# Patient Record
Sex: Male | Born: 1985 | Race: Black or African American | Marital: Single | Smoking: Current every day smoker
Health system: Northeastern US, Academic
[De-identification: ages and names within clinical notes are randomized; demographics above are authoritative.]

## PROBLEM LIST (undated history)

## (undated) DIAGNOSIS — E669 Obesity, unspecified: Secondary | ICD-10-CM

## (undated) HISTORY — PX: ORTHOPEDIC SURGERY: SHX850

## (undated) NOTE — ED Provider Notes (Signed)
Formatting of this note is different from the original.  [facility]  History   57 year old nondiabetic man presents with abscess and drainage of his right leg.  Per the patient, he had surgery with hardware in 2021 to this affected site.  He states for the last 2 to 3 days he has noticed redness and warmth.  There was no recent antecedent trauma.  He reports no insect stings or bites to the area.  He states that the wound started draining spontaneously this morning.    History provided by:  Patient    There is no problem list on file for this patient.    History reviewed. No pertinent past medical history.    Past Surgical History:   Procedure Laterality Date   ? ORIF TIBIA & FIBULA FRACTURES  2021     No family history on file.    Social History     Tobacco Use   ? Smoking status: Every Day     Packs/day: 1.00     Years: 15.00     Pack years: 15.00     Types: Cigarettes   Substance Use Topics   ? Alcohol use: Not Currently   ? Drug use: Not Currently     Sexual Activity     Substance and Sexual Activity   Sexual Activity Not on file     Review of Systems   Constitutional: Negative for chills and fever.   Musculoskeletal: Positive for arthralgias.   Skin: Positive for color change and wound.     Physical Exam     Vitals:    10/22/21 1107   BP: 119/83   Pulse: 78   Resp: 16   Temp: 36.6 C (97.9 F)   TempSrc: Temporal   SpO2: 96%     Physical Exam  Vitals and nursing note reviewed.   Constitutional:       Appearance: Normal appearance. He is not toxic-appearing.   Cardiovascular:      Rate and Rhythm: Normal rate.   Pulmonary:      Effort: Pulmonary effort is normal.   Musculoskeletal:         General: Swelling and tenderness present.      Comments: Patient has 1+ edema from his upper shin to his foot.   Skin:     Capillary Refill: Capillary refill takes less than 2 seconds.      Findings: Erythema and lesion present.      Comments: There is a 3 cm central abscess exiting the patient's previous surgical scar.   There is free-flowing purulent material from the wound.  The area is exquisitely tender.  There is a surrounding 8 x 10 cm area of erythema, warmth and induration.   Neurological:      Mental Status: He is alert and oriented to person, place, and time.   Psychiatric:         Mood and Affect: Mood normal.         Behavior: Behavior normal.         Thought Content: Thought content normal.     ED Procedures   Procedures    ED Course   ED MDM:     Differential Diagnosis:     Differential diagnosis includes:  Abscess, wound infection, hardware infection.    ED Course:     ED course details:  Patient was seen and evaluated.  We discussed imaging at the urgent care however, his case would need to be seen  by orthopedics given the underlying hardware.  Patient opted to go to [facility] directly for full treatment of his wound.  The wound was dressed with nonstick dressing and gauze roll.    Discussion with independent historian(s):     Independent historian(s) utilized:  Patient    Disposition:     Discharge: I had a discussion with the patient and/or guardian regarding discharge diagnosis and plan.  Based on the patient's history, exam and diagnostic evaluation, there is no indication for further emergent intervention or inpatient treatment.  Verbal and written discharge instructions and warnings were provided. Patient was encouraged to return for any worsening symptoms, persisting symptoms, or any other concerns. Patient was provided the opportunity to ask questions.        Follow up: Patient was advised to follow-up as instructed.                  ED Attestation   Attestation      Hoople, Amy, DO  10/22/21 1151    Electronically signed by Laurena Spies, DO at 10/22/2021 11:51 AM EDT

## (undated) NOTE — ED Notes (Signed)
Formatting of this note might be different from the original.  Abscess to right lower leg. Drained spontaneously after taking hot shower this am, purulent drainage.   Electronically signed by Ailene Ards, RN at 10/22/2021 11:05 AM EDT

---

## 2010-11-13 ENCOUNTER — Emergency Department
Admission: EM | Admit: 2010-11-13 | Discharge status: Against Medical Advice | Payer: Self-pay | Source: Ambulatory Visit

## 2012-09-02 ENCOUNTER — Encounter: Payer: Self-pay | Admitting: Emergency Medicine

## 2012-09-02 ENCOUNTER — Emergency Department
Admission: EM | Admit: 2012-09-02 | Disposition: A | Discharge status: Home / Self Care | Payer: Self-pay | Source: Ambulatory Visit | Attending: Emergency Medicine | Admitting: Emergency Medicine

## 2012-09-02 MED ORDER — IBUPROFEN 800 MG PO TABS *I*
800.0000 mg | ORAL_TABLET | Freq: Three times a day (TID) | ORAL | Status: DC | PRN
Start: 2012-09-02 — End: 2021-12-28

## 2012-09-02 MED ORDER — IBUPROFEN 200 MG PO TABS *I*
800.0000 mg | ORAL_TABLET | Freq: Once | ORAL | Status: AC
Start: 2012-09-02 — End: 2012-09-02
  Administered 2012-09-02: 800 mg via ORAL
  Filled 2012-09-02: qty 4

## 2012-09-02 NOTE — ED Procedure Documentation (Signed)
Procedures   Procedures    Dental  Tooth #15- large cavity. Area dried with cotton. Cement mixed and filled in area exposed. Pt tolerated without issue.   Harrietta Guardian, MD

## 2012-09-02 NOTE — ED Notes (Signed)
Pt c/o pain to broken L upper back molar, no swelling noted to mucus membranes, pt medicated as per order see MAR, significant other at bedside, call bell in reach, side rails up x 1

## 2012-09-02 NOTE — ED Notes (Signed)
Pt D/C as per order, pt acknowledged verbal and written D/C instructions, pt ambulates with steady gait

## 2012-09-02 NOTE — ED Provider Notes (Signed)
History     Chief Complaint   Patient presents with   . Dental Pain     Pt to ED with c/o L upper dental pain since yesterday.  States it is really throbbing this morning.       HPI Comments: 27yo male with dental pain. He has had a cavity in his left upper teeth for about a year. It has been hurting since yesterday and worse this morning. No fevers. No trismus. No facial swelling. Pt has pain with air touching area. Didn't take any medication. He doesn't have a dentist but there is one near his house.       History provided by:  Patient  Language interpreter used: No    Is this ED visit related to civilian activity for income:  Not work related      History reviewed. No pertinent past medical history.         History reviewed. No pertinent past surgical history.    History reviewed. No pertinent family history.      Social History      has no tobacco, alcohol, drug, and sexual activity history on file.    Living Situation    Questions Responses    Patient lives with     Homeless     Caregiver for other family member     External Services     Employment     Domestic Violence Risk           Problem List     There is no problem list on file for this patient.      Review of Systems   Review of Systems   Constitutional: Negative for fever, chills, activity change, appetite change and unexpected weight change.   HENT: Positive for dental problem. Negative for hearing loss, nosebleeds, congestion, sore throat, rhinorrhea, trouble swallowing, neck pain and neck stiffness.    Respiratory: Negative for cough, chest tightness, shortness of breath and wheezing.    Cardiovascular: Negative for chest pain, palpitations and leg swelling.   Gastrointestinal: Negative for nausea, vomiting, abdominal pain, diarrhea, abdominal distention and anal bleeding.   Genitourinary: Negative for urgency, hematuria, difficulty urinating and testicular pain.   Musculoskeletal: Negative for myalgias, back pain and arthralgias.   Skin:  Negative for color change, rash and wound.   Neurological: Negative for dizziness, seizures, syncope, facial asymmetry, numbness and headaches.   Psychiatric/Behavioral: Negative for suicidal ideas, hallucinations, confusion and agitation.       Physical Exam     ED Triage Vitals   BP Heart Rate Heart Rate(via Pulse Ox) Resp Temp Temp Source SpO2 O2 Device O2 Flow Rate   09/02/12 0747 09/02/12 0747 -- 09/02/12 0747 09/02/12 0747 09/02/12 0747 09/02/12 0747 09/02/12 0747 --   157/82 mmHg 71  18 36.6 C (97.9 F) TEMPORAL 98 % None (Room air)       Weight           09/02/12 0747           92.987 kg (205 lb)               Physical Exam   Nursing note and vitals reviewed.  Constitutional: He is oriented to person, place, and time. He appears well-developed and well-nourished.   HENT:   Head: Normocephalic and atraumatic.   Mouth/Throat: Dental caries present.       Eyes: EOM are normal. Pupils are equal, round, and reactive to light.   Neck: Normal range  of motion. Neck supple. No JVD present.   Cardiovascular: Normal rate, regular rhythm, normal heart sounds and intact distal pulses.    Pulmonary/Chest: Effort normal and breath sounds normal. No stridor. He has no wheezes. He exhibits no tenderness.   Abdominal: Soft. Bowel sounds are normal. He exhibits no distension. There is no tenderness. There is no rebound and no guarding.   Musculoskeletal: Normal range of motion. He exhibits no tenderness.   Neurological: He is alert and oriented to person, place, and time.   Skin: Skin is warm and dry.   Psychiatric: He has a normal mood and affect. His behavior is normal. Judgment and thought content normal.       Medical Decision Making   <EDMDM>    Initial Evaluation:  ED First Provider Contact    Date/Time Event User Comments    09/02/12 0750 ED Provider First Contact Shaunn Tackitt S Initial Face to Face Provider Contact          Patient seen by me on arrival date of 09/02/2012 at at time of arrival  7:41 AM.  Initial  face to face evaluation time noted above may be discrepant due to patient acuity and delay in documentation.    Assessment:  27 y.o., male comes to the ED with dental pain- cavity    Differential Diagnosis includes cavity tooth #15              Plan: cement coverage, motrin 800mg , no need for antibiotics- no infection evident. Follow up with dentist.       Harrietta Guardian, MD          Harrietta Guardian, MD  09/02/12 [phone]

## 2012-09-02 NOTE — Discharge Instructions (Signed)
The cement covering your tooth should last 1-2 days. Take Motrin for pain. Stay on a soft diet. Follow up with the dentist tomorrow. You can either use one near your house or call [facility] at [place]. They are open 8:30-4pm.     Return to the ED if -  You have a fever.   You develop redness and swelling of your face, jaw, or neck.   You develop swelling around a tooth.   You are unable to open your mouth or cannot swallow.

## 2012-10-24 ENCOUNTER — Emergency Department: Admit: 2012-10-24 | Disposition: A | Discharge status: Home / Self Care | Payer: Self-pay | Source: Ambulatory Visit

## 2012-10-24 ENCOUNTER — Encounter: Payer: Self-pay | Admitting: Emergency Medicine

## 2012-10-24 MED ORDER — CEPHALEXIN 500 MG PO CAPS *I*
500.0000 mg | ORAL_CAPSULE | Freq: Four times a day (QID) | ORAL | Status: DC
Start: 2012-10-24 — End: 2012-10-28

## 2012-10-24 NOTE — Discharge Instructions (Signed)
Follow up with pcp  Take antibiotics as directed  Ibuprofen for pain

## 2012-10-24 NOTE — ED Provider Notes (Signed)
History     Chief Complaint   Patient presents with   . Groin Swelling     HPI Comments: This is a 27 yo male who presents to the ED with complaints of lump in right groin. No fevers or chills. No nausea.  States present x 24 hrs.   Denies straining.       History provided by:  Patient      History reviewed. No pertinent past medical history.         History reviewed. No pertinent past surgical history.    History reviewed. No pertinent family history.      Social History      reports that he has been smoking.  He does not have any smokeless tobacco history on file. He reports that  drinks alcohol. His drug and sexual activity histories are not on file.    Living Situation    Questions Responses    Patient lives with Alone    Homeless     Caregiver for other family member     External Services     Employment     Domestic Violence Risk           Problem List     There is no problem list on file for this patient.      Review of Systems   Review of Systems   Constitutional: Negative for fever and chills.   Gastrointestinal: Negative for nausea, vomiting and abdominal pain.   Genitourinary: Negative for dysuria.   Skin: Negative for wound.   All other systems reviewed and are negative.        Physical Exam     ED Triage Vitals   BP Pulse Heart Rate(via Pulse Ox) Resp Temp Temp Source SpO2 O2 Device O2 Flow Rate   10/24/12 2025 -- 10/24/12 2025 10/24/12 2025 10/24/12 2025 10/24/12 2025 10/24/12 2025 10/24/12 2025 --   137/72 mmHg  85 16 37.6 C (99.7 F) TEMPORAL 100 % None (Room air)       Weight           10/24/12 2025           92.987 kg (205 lb)               Physical Exam   Constitutional: He is oriented to person, place, and time. He appears well-developed and well-nourished.   HENT:   Head: Normocephalic and atraumatic.   Eyes: Conjunctivae and EOM are normal.   Neck: Normal range of motion.   Pulmonary/Chest: Effort normal.   Abdominal: Soft. Hernia confirmed negative in the right inguinal area.              Musculoskeletal: Normal range of motion.   Neurological: He is alert and oriented to person, place, and time.   Skin: Skin is warm and dry.   Psychiatric: He has a normal mood and affect.       Medical Decision Making   <EDMDM>    Initial Evaluation:  ED First Provider Contact    Date/Time Event User Comments    10/24/12 2134 ED Provider First Contact Corliss Skains J Initial Face to Face Provider Contact          Patient seen by me as above    Assessment:  27 y.o., male comes to the ED with indurated region in right groin    Differential Diagnosis includes enlarge lymph node, early abscess  Plan: eval, antibiotics      Landis Martins, PA    Supervising physician Dr. Sima Matas was immediately available      Landis Martins, [place]  10/24/12 2231

## 2012-10-24 NOTE — ED Notes (Signed)
States " lump" right side of groin x 1 day. Denies any injury

## 2012-10-25 ENCOUNTER — Encounter: Payer: Self-pay | Admitting: Emergency Medicine

## 2012-10-25 LAB — CBC AND DIFFERENTIAL
Baso # K/uL: 0 10*3/uL (ref 0.0–0.1)
Basophil %: 0.2 % (ref 0.2–1.2)
Eos # K/uL: 0.1 10*3/uL (ref 0.0–0.5)
Eosinophil %: 1 % (ref 0.8–7.0)
Hematocrit: 43 % (ref 40–51)
Hemoglobin: 15 g/dL (ref 13.7–17.5)
Lymph # K/uL: 1.5 10*3/uL (ref 1.3–3.6)
Lymphocyte %: 12.1 % — ABNORMAL LOW (ref 21.8–53.1)
MCV: 93 fL — ABNORMAL HIGH (ref 79–92)
Mono # K/uL: 1.1 10*3/uL — ABNORMAL HIGH (ref 0.3–0.8)
Monocyte %: 9 % (ref 5.3–12.2)
Neut # K/uL: 9.3 10*3/uL — ABNORMAL HIGH (ref 1.8–5.4)
Platelets: 195 10*3/uL (ref 150–330)
RBC: 4.7 MIL/uL (ref 4.6–6.1)
RDW: 13.5 % (ref 11.6–14.4)
Seg Neut %: 77.5 % — ABNORMAL HIGH (ref 34.0–67.9)
WBC: 12 10*3/uL — ABNORMAL HIGH (ref 4.2–9.1)

## 2012-10-25 LAB — BASIC METABOLIC PANEL
Anion Gap: 10 (ref 7–16)
CO2: 27 mmol/L (ref 20–28)
Calcium: 8.7 mg/dL — ABNORMAL LOW (ref 9.0–10.3)
Chloride: 101 mmol/L (ref 96–108)
Creatinine: 0.9 mg/dL (ref 0.67–1.17)
GFR,Black: 136 *
GFR,Caucasian: 118 *
Glucose: 94 mg/dL (ref 60–99)
Lab: 10 mg/dL (ref 6–20)
Potassium: 4.2 mmol/L (ref 3.3–5.1)
Sodium: 138 mmol/L (ref 133–145)

## 2012-10-25 LAB — LACTATE, VENOUS, WHOLE BLOOD: Lactate VEN,WB: 1.2 mmol/L (ref 0.5–2.2)

## 2012-10-25 MED ORDER — HYDROMORPHONE HCL PF 1 MG/ML IJ SOLN *WRAPPED*
1.0000 mg | Freq: Once | INTRAMUSCULAR | Status: AC
Start: 2012-10-25 — End: 2012-10-25

## 2012-10-25 MED ORDER — SODIUM CHLORIDE 0.9 % IV BOLUS *I*
1000.0000 mL | Freq: Once | Status: AC
Start: 2012-10-25 — End: 2012-10-25
  Administered 2012-10-25: 1000 mL via INTRAVENOUS

## 2012-10-25 MED ORDER — CLINDAMYCIN PHOSPHATE IN D5W 900 MG/50ML IV SOLN *I*
INTRAVENOUS | Status: AC
Start: 2012-10-25 — End: 2012-10-25
  Administered 2012-10-25: 900 mg via INTRAVENOUS
  Filled 2012-10-25: qty 50

## 2012-10-25 MED ORDER — CLINDAMYCIN PHOSPHATE IN D5W 900 MG/50ML IV SOLN *I*
900.0000 mg | Freq: Once | INTRAVENOUS | Status: AC
Start: 2012-10-25 — End: 2012-10-25

## 2012-10-25 MED ORDER — ONDANSETRON HCL 2 MG/ML IV SOLN *I*
INTRAMUSCULAR | Status: AC
Start: 2012-10-25 — End: 2012-10-25
  Administered 2012-10-25: 4 mg via INTRAVENOUS
  Filled 2012-10-25: qty 2

## 2012-10-25 MED ORDER — HYDROMORPHONE HCL PF 1 MG/ML IJ SOLN *WRAPPED*
1.0000 mg | Freq: Once | INTRAMUSCULAR | Status: AC
Start: 2012-10-26 — End: 2012-10-25
  Administered 2012-10-25: 1 mg via INTRAVENOUS
  Filled 2012-10-25: qty 1

## 2012-10-25 MED ORDER — ONDANSETRON HCL 2 MG/ML IV SOLN *I*
4.0000 mg | Freq: Once | INTRAMUSCULAR | Status: AC
Start: 2012-10-25 — End: 2012-10-25

## 2012-10-25 MED ORDER — HYDROMORPHONE HCL PF 1 MG/ML IJ SOLN *WRAPPED*
INTRAMUSCULAR | Status: AC
Start: 2012-10-25 — End: 2012-10-25
  Administered 2012-10-25: 1 mg via INTRAVENOUS
  Filled 2012-10-25: qty 1

## 2012-10-26 ENCOUNTER — Inpatient Hospital Stay
Admit: 2012-10-26 | Disposition: A | Discharge status: Home / Self Care | Payer: Self-pay | Source: Ambulatory Visit | Admitting: Emergency Medicine

## 2012-10-26 ENCOUNTER — Encounter: Payer: Self-pay | Admitting: Emergency Medicine

## 2012-10-26 DIAGNOSIS — L0390 Cellulitis, unspecified: Principal | ICD-10-CM | POA: Diagnosis present

## 2012-10-26 LAB — CBC AND DIFFERENTIAL
Baso # K/uL: 0 10*3/uL (ref 0.0–0.1)
Basophil %: 0.2 % (ref 0.2–1.2)
Eos # K/uL: 0.2 10*3/uL (ref 0.0–0.5)
Eosinophil %: 1.9 % (ref 0.8–7.0)
Hematocrit: 37 % — ABNORMAL LOW (ref 40–51)
Hemoglobin: 12.6 g/dL — ABNORMAL LOW (ref 13.7–17.5)
Lymph # K/uL: 2.1 10*3/uL (ref 1.3–3.6)
Lymphocyte %: 20.4 % — ABNORMAL LOW (ref 21.8–53.1)
MCV: 93 fL — ABNORMAL HIGH (ref 79–92)
Mono # K/uL: 1.1 10*3/uL — ABNORMAL HIGH (ref 0.3–0.8)
Monocyte %: 10.4 % (ref 5.3–12.2)
Neut # K/uL: 6.9 10*3/uL — ABNORMAL HIGH (ref 1.8–5.4)
Platelets: 178 10*3/uL (ref 150–330)
RBC: 4 MIL/uL — ABNORMAL LOW (ref 4.6–6.1)
RDW: 13.6 % (ref 11.6–14.4)
Seg Neut %: 66.8 % (ref 34.0–67.9)
WBC: 10.2 10*3/uL — ABNORMAL HIGH (ref 4.2–9.1)

## 2012-10-26 LAB — AEROBIC CULTURE

## 2012-10-26 LAB — BASIC METABOLIC PANEL
Anion Gap: 8 (ref 7–16)
CO2: 23 mmol/L (ref 20–28)
Calcium: 8.2 mg/dL — ABNORMAL LOW (ref 9.0–10.3)
Chloride: 104 mmol/L (ref 96–108)
Creatinine: 0.75 mg/dL (ref 0.67–1.17)
GFR,Black: 147 *
GFR,Caucasian: 127 *
Glucose: 103 mg/dL — ABNORMAL HIGH (ref 60–99)
Lab: 7 mg/dL (ref 6–20)
Potassium: 3.9 mmol/L (ref 3.3–5.1)
Sodium: 135 mmol/L (ref 133–145)

## 2012-10-26 MED ORDER — IBUPROFEN 400 MG PO TABS *I*
400.0000 mg | ORAL_TABLET | Freq: Four times a day (QID) | ORAL | Status: DC | PRN
Start: 2012-10-26 — End: 2012-10-28
  Administered 2012-10-26 – 2012-10-28 (×5): 400 mg via ORAL
  Filled 2012-10-26 (×5): qty 1

## 2012-10-26 MED ORDER — ONDANSETRON HCL 2 MG/ML IV SOLN *I*
4.0000 mg | Freq: Four times a day (QID) | INTRAMUSCULAR | Status: AC | PRN
Start: 2012-10-26 — End: 2012-10-28

## 2012-10-26 MED ORDER — CLINDAMYCIN PHOSPHATE IN D5W 600 MG/50ML IV SOLN *I*
600.0000 mg | Freq: Four times a day (QID) | INTRAVENOUS | Status: DC
Start: 2012-10-26 — End: 2012-10-26

## 2012-10-26 MED ORDER — OXYCODONE-ACETAMINOPHEN 5-325 MG PO TABS *I*
2.0000 | ORAL_TABLET | ORAL | Status: DC | PRN
Start: 2012-10-26 — End: 2012-10-28
  Administered 2012-10-26 – 2012-10-28 (×11): 2 via ORAL
  Filled 2012-10-26 (×11): qty 2

## 2012-10-26 MED ORDER — CLINDAMYCIN PHOSPHATE IN D5W 900 MG/50ML IV SOLN *I*
900.0000 mg | Freq: Three times a day (TID) | INTRAVENOUS | Status: DC
Start: 2012-10-26 — End: 2012-10-28
  Administered 2012-10-26 – 2012-10-28 (×7): 900 mg via INTRAVENOUS
  Filled 2012-10-26 (×10): qty 50

## 2012-10-26 NOTE — ED Notes (Signed)
10/26/12 0148   Observation Care   Observation care initiated  Yes   Patient has been verbally notified of their observation status Yes

## 2012-10-26 NOTE — Progress Notes (Signed)
Patient spoke to Service via phone.  Patient reports a preference diet of no beef.  Writer has added the diet preference of Vegetarian Semi to current diet per patient request.

## 2012-10-26 NOTE — Progress Notes (Signed)
Utilization Management    Level of Care Observation service as of the date 10/25/2012      Sandrea Hughs, RN     Pager: [phone]

## 2012-10-26 NOTE — ED Provider Notes (Addendum)
History     Chief Complaint   Patient presents with   . Groin Swelling     States he was here yesterday and the medicine that they gave me for my problem made it worse. States it is starting to get bigger and "now my penis is swollen".     HPI Comments: This is a 27 yo male who presents with complaints of worsening swelling and pain and now redness to right groin. Was seen yesterday and i diagnosed pt with enlarged lymph nodes. Has been present now x 48 hrs. Pt was started on keflex and discharged. Pt states this afternoon worsened and pain increased. Has been taking keflex as directed      History provided by:  Patient      History reviewed. No pertinent past medical history.         History reviewed. No pertinent past surgical history.    History reviewed. No pertinent family history.      Social History      reports that he has been smoking.  He does not have any smokeless tobacco history on file. He reports that  drinks alcohol. His drug and sexual activity histories are not on file.    Living Situation    Questions Responses    Patient lives with Spouse    Homeless     Caregiver for other family member     External Services     Employment     Domestic Violence Risk           Problem List     There is no problem list on file for this patient.      Review of Systems   Review of Systems   Constitutional: Positive for fever. Negative for chills.   Respiratory: Negative for shortness of breath.    Cardiovascular: Negative for chest pain.   Gastrointestinal: Negative for nausea, vomiting and abdominal pain.   Genitourinary: Negative for dysuria, flank pain, penile swelling, scrotal swelling, difficulty urinating and testicular pain.        Right groin swelling     Musculoskeletal: Negative for myalgias.   Skin: Negative for rash.   All other systems reviewed and are negative.        Physical Exam     ED Triage Vitals   BP Heart Rate Heart Rate(via Pulse Ox) Resp Temp Temp Source SpO2 O2 Device O2 Flow Rate      10/25/12 2120 10/25/12 2120 -- 10/25/12 2120 10/25/12 2120 10/25/12 2120 10/25/12 2120 10/25/12 2120 --   144/58 mmHg 112  18 38.1 C (100.6 F) TEMPORAL 100 % None (Room air)       Weight           10/25/12 2120           92.987 kg (205 lb)               Physical Exam   Constitutional: He is oriented to person, place, and time. He appears well-developed and well-nourished.   HENT:   Head: Normocephalic and atraumatic.   Mouth/Throat: Oropharynx is clear and moist.   Eyes: Conjunctivae and EOM are normal.   Neck: Normal range of motion.   Cardiovascular: Regular rhythm and normal heart sounds.  Tachycardia present.    Pulmonary/Chest: Effort normal and breath sounds normal.   Abdominal: Soft. Bowel sounds are normal.   Musculoskeletal: Normal range of motion.        Legs:  Neurological: He is  alert and oriented to person, place, and time.   Skin: Skin is warm and dry.   Psychiatric: He has a normal mood and affect.       Medical Decision Making      Amount and/or Complexity of Data Reviewed  Clinical lab tests: ordered and reviewed  Tests in the radiology section of CPT: ordered and reviewed      Recent Results (from the past 24 hour(s))   CBC AND DIFFERENTIAL    Collection Time     10/25/12 10:05 PM       Result Value Range    WBC 12.0 (*) 4.2 - 9.1 THOU/uL    RBC 4.7  4.6 - 6.1 MIL/uL    Hemoglobin 15.0  13.7 - 17.5 g/dL    Hematocrit 43  40 - 51 %    MCV 93 (*) 79 - 92 fL    RDW 13.5  11.6 - 14.4 %    Platelets 195  150 - 330 THOU/uL    Seg Neut % 77.5 (*) 34.0 - 67.9 %    Lymphocyte % 12.1 (*) 21.8 - 53.1 %    Monocyte % 9.0  5.3 - 12.2 %    Eosinophil % 1.0  0.8 - 7.0 %    Basophil % 0.2  0.2 - 1.2 %    Neut # K/uL 9.3 (*) 1.8 - 5.4 THOU/uL    Lymph # K/uL 1.5  1.3 - 3.6 THOU/uL    Mono # K/uL 1.1 (*) 0.3 - 0.8 THOU/uL    Eos # K/uL 0.1  0.0 - 0.5 THOU/uL    Baso # K/uL 0.0  0.0 - 0.1 THOU/uL   BASIC METABOLIC PANEL    Collection Time     10/25/12 10:05 PM       Result Value Range    Glucose 94  60 - 99  mg/dL    Sodium 161  096 - 045 mmol/L    Potassium 4.2  3.3 - 5.1 mmol/L    Chloride 101  96 - 108 mmol/L    CO2 27  20 - 28 mmol/L    Anion Gap 10  7 - 16    UN 10  6 - 20 mg/dL    Creatinine 4.09  8.11 - 1.17 mg/dL    GFR,Caucasian 914      GFR,Black 136      Calcium 8.7 (*) 9.0 - 10.3 mg/dL   BLOOD CULTURE    Collection Time     10/25/12 10:05 PM       Result Value Range    Bacterial Blood Culture .     LACTATE, VENOUS, WHOLE BLOOD    Collection Time     10/25/12 10:05 PM       Result Value Range    Lactate VEN,WB 1.2  0.5 - 2.2 mmol/L     Ct Pelvis With Contrast    10/26/2012        * * P R E L I M I N A R Y  R E P O R T * *  Exam Site: [facility]  10/25/2012 11:39 PM CT PELVIS WITH CONTRAST   ORDERING CLINICAL INFORMATION:  ERECORD: ? abscess right groin,  tender overlying celulitis ADDITIONAL CLINICAL INFORMATION:  None.   COMPARISON:  None.   PROCEDURE: CT images were acquired from the Iliac crests through the  mid thighs.  123 cc of Omnipaque 350 IV contrast was administered.  PELVIC FINDINGS:   Visualized Reproductive Organs: Normal   Bladder: Normal   Lymph Nodes: Inguinal lymphadenopathy on the right, likely reactive.   Vessels: Normal   Pelvic GI Tract/Mesentery and Peritoneal Cavity: No free fluid.  Visualized portion of the bowel is unremarkable.   Soft Tissues/Musculoskeletal: There is right anterior skin thickening  and inflammatory stranding with edema. No discrete drainable  collections. No evidence of muscle or bone involvement.   No hernias.  No acute bone abnormality.         * * P R E L I M I N A R Y  R E P O R T * *    10/26/2012   IMPRESSION:   Right anterior pelvis soft tissue changes consistent with cellulitis  with no discrete drainable collections.       END REPORT       I have personally reviewed the image(s) and the resident's  interpretation and agree with or edited the findings.      Initial Evaluation:  ED First Provider Contact    Date/Time Event User Comments     10/25/12 2126 ED Provider First Contact Corliss Skains J Initial Face to Face Provider Contact          Patient seen by me as above    Assessment:  27 y.o., male comes to the ED with right groin swelling, pain    Differential Diagnosis includes cellulitis, abscess,  Enlarge lymph nodes             Plan: eval, Ct pelvis, pain control, labs, will attempt I&D.    I&D completed, no drainage noted. Will reassess after ct pelvis. Will start clindamycin IV      Landis Martins, PA    Supervising physician Dr. Genevie Ann was immediately available      Landis Martins, PA  10/26/12 0010    Landis Martins, [place]  10/26/12 0051    APP Review:     I had face-to-face interaction with the patient on arrival date of 10/25/2012 at the time of arrival 9:15 PM.      I was asked by APP to see this patient due to the complexity of the current medical presentation.    I have reviewed and agree with the above documentation and, in addition, the history is notable for groin swelling, nothing drainable, ct negative. Failing keflex. IV antibiotics overnight...         Author Harrietta Guardian, MD      Harrietta Guardian, MD  10/26/12 [phone]

## 2012-10-26 NOTE — ED Notes (Signed)
Right groin dressing change.

## 2012-10-26 NOTE — ED Obs Notes (Signed)
ED OBSERVATION ADMISSION NOTE    Patient seen by me today, 10/26/2012 at 0655    Current patient status: Observation    History     Chief Complaint   Patient presents with   . Groin Swelling     States he was here yesterday and the medicine that they gave me for my problem made it worse. States it is starting to get bigger and "now my penis is swollen".     HPI Comments: Per ED provider:  This is a 27 yo male who presents with complaints of worsening swelling and pain and now redness to right groin. Was seen yesterday and i diagnosed pt with enlarged lymph nodes. Has been present now x 48 hrs. Pt was started on keflex and discharged. Pt states this afternoon worsened and pain increased. Has been taking keflex as directed      Patient is a 27 y.o. male presenting with skin problem.   History provided by:  Medical records and patient  Skin Problem  Location:  Ano-genital  Ano-genital rash location:  Groin  Quality: painful and swelling    Pain details:     Quality:  Aching    Onset quality:  Gradual    Duration:  2 days    Progression:  Worsening  Ineffective treatments: out patient keflex 10-25-12.  Associated symptoms: fever (38.1)    Associated symptoms: no abdominal pain, no headaches, no joint pain, no nausea, no shortness of breath and not vomiting        History reviewed. No pertinent past medical history.    History reviewed. No pertinent past surgical history.    Family History   Problem Relation Age of Onset   . Heart disease Maternal Aunt    . Heart disease Maternal Grandmother        Social History      reports that he has been smoking.  He does not have any smokeless tobacco history on file. He reports that  drinks alcohol. His drug and sexual activity histories are not on file.    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 Negative for appetite change.   HENT: Negative for congestion.    Eyes: Negative for visual disturbance.   Respiratory: Negative for shortness of breath.    Cardiovascular: Negative for chest pain.   Gastrointestinal: Negative for nausea, vomiting and abdominal pain.   Genitourinary: Negative for dysuria.   Musculoskeletal: Negative for arthralgias.   Skin: Positive for color change (HPI).   Neurological: Negative for headaches.   Psychiatric/Behavioral: Negative for confusion.       Physical Exam   BP 104/70  Pulse 69  Temp(Src) 36.2 C (97.2 F) (Temporal)  Resp 16  Ht 1.803 m (5\' 11" )  Wt 92.987 kg (205 lb)  BMI 28.6 kg/m2  SpO2 99%    Physical Exam   Vitals reviewed.  Constitutional: No distress.   205 pounds   Cardiovascular: Normal rate.    Pulmonary/Chest: Effort normal.   Abdominal: Soft. There is no tenderness. There is no rebound and no guarding.  Genitourinary:   Right pubic STS, tenderness, induration   Musculoskeletal: He exhibits no tenderness.   Neurological: He is alert.   Skin: He is not diaphoretic.   Psychiatric: He has a normal mood and affect.       Tests    ZOX:WRUE    Labs:   All labs in the last 24 hours:   Recent Results (from the past 24 hour(s))   CBC AND DIFFERENTIAL    Collection Time     10/25/12 10:05 PM       Result Value Range    WBC 12.0 (*) 4.2 - 9.1 THOU/uL    RBC 4.7  4.6 - 6.1 MIL/uL    Hemoglobin 15.0  13.7 - 17.5 g/dL    Hematocrit 43  40 - 51 %    MCV 93 (*) 79 - 92 fL    RDW 13.5  11.6 - 14.4 %    Platelets 195  150 - 330 THOU/uL    Seg Neut % 77.5 (*) 34.0 - 67.9 %    Lymphocyte % 12.1 (*) 21.8 - 53.1 %    Monocyte % 9.0  5.3 - 12.2 %    Eosinophil % 1.0  0.8 - 7.0 %    Basophil % 0.2  0.2 - 1.2 %    Neut # K/uL 9.3 (*) 1.8 - 5.4 THOU/uL    Lymph # K/uL 1.5  1.3 - 3.6 THOU/uL    Mono # K/uL 1.1 (*) 0.3 - 0.8 THOU/uL    Eos # K/uL 0.1  0.0 - 0.5 THOU/uL    Baso # K/uL 0.0  0.0 -  0.1 THOU/uL   BASIC METABOLIC PANEL    Collection Time     10/25/12 10:05 PM       Result Value Range    Glucose 94  60 - 99 mg/dL    Sodium 454  098 - 119 mmol/L    Potassium 4.2  3.3 - 5.1 mmol/L    Chloride 101  96 - 108 mmol/L    CO2 27  20 - 28 mmol/L    Anion Gap 10  7 - 16    UN 10  6 - 20 mg/dL    Creatinine 1.47  8.29 - 1.17 mg/dL    GFR,Caucasian 562      GFR,Black 136      Calcium 8.7 (*) 9.0 - 10.3 mg/dL   BLOOD CULTURE    Collection Time     10/25/12 10:05 PM       Result Value Range    Bacterial Blood Culture .     LACTATE, VENOUS, WHOLE BLOOD    Collection Time     10/25/12 10:05 PM       Result Value Range    Lactate VEN,WB 1.2  0.5 - 2.2 mmol/L   BASIC METABOLIC PANEL    Collection Time     10/26/12  5:07 AM       Result Value Range    Glucose 103 (*) 60 - 99 mg/dL    Sodium 130  865 - 784 mmol/L    Potassium 3.9  3.3 - 5.1 mmol/L    Chloride 104  96 - 108 mmol/L    CO2 23  20 - 28 mmol/L    Anion Gap 8  7 - 16    UN 7  6 - 20 mg/dL    Creatinine 6.96  2.95 - 1.17 mg/dL    GFR,Caucasian 284  GFR,Black 147      Calcium 8.2 (*) 9.0 - 10.3 mg/dL   CBC AND DIFFERENTIAL    Collection Time     10/26/12  5:07 AM       Result Value Range    WBC 10.2 (*) 4.2 - 9.1 THOU/uL    RBC 4.0 (*) 4.6 - 6.1 MIL/uL    Hemoglobin 12.6 (*) 13.7 - 17.5 g/dL    Hematocrit 37 (*) 40 - 51 %    MCV 93 (*) 79 - 92 fL    RDW 13.6  11.6 - 14.4 %    Platelets 178  150 - 330 THOU/uL    Seg Neut % 66.8  34.0 - 67.9 %    Lymphocyte % 20.4 (*) 21.8 - 53.1 %    Monocyte % 10.4  5.3 - 12.2 %    Eosinophil % 1.9  0.8 - 7.0 %    Basophil % 0.2  0.2 - 1.2 %    Neut # K/uL 6.9 (*) 1.8 - 5.4 THOU/uL    Lymph # K/uL 2.1  1.3 - 3.6 THOU/uL    Mono # K/uL 1.1 (*) 0.3 - 0.8 THOU/uL    Eos # K/uL 0.2  0.0 - 0.5 THOU/uL    Baso # K/uL 0.0  0.0 - 0.1 THOU/uL        Imaging:IS Ct Pelvis With Contrast    10/26/2012   IMPRESSION:   1. Right anterior pelvis superficial soft tissue changes consistent  with cellulitis with no discrete drainable  collections.   2. Regional right inguinal lymphadenopathy, likely secondary to  regional cellulitis/infection.       END REPORT       I have personally reviewed the image(s) and the resident's  interpretation and agree with or edited the findings.         Medical Decision Making   <EDMDM>    Assessment:  27 y.o., male placed in OBS after evaluation in the ED for  Groin/pubic STS and pain, WBC 10.2, lactate 1.2, no abscess on CT    Differential Diagnosis includes : cellulitis, no evidence for abscess, no sepsis        Plan: IV clindamycin, warm compresses, analgesia, patient declines nicotine replacement, blood culture pending  Medically preferred DVT prophylaxis: None- short stay, ambulatory      Freddy Finner, MD

## 2012-10-27 DIAGNOSIS — Z720 Tobacco use: Secondary | ICD-10-CM | POA: Diagnosis present

## 2012-10-27 LAB — CBC AND DIFFERENTIAL
Baso # K/uL: 0 10*3/uL (ref 0.0–0.1)
Basophil %: 0.1 % — ABNORMAL LOW (ref 0.2–1.2)
Eos # K/uL: 0.2 10*3/uL (ref 0.0–0.5)
Eosinophil %: 2.3 % (ref 0.8–7.0)
Hematocrit: 39 % — ABNORMAL LOW (ref 40–51)
Hemoglobin: 13.2 g/dL — ABNORMAL LOW (ref 13.7–17.5)
Lymph # K/uL: 1.7 10*3/uL (ref 1.3–3.6)
Lymphocyte %: 20 % — ABNORMAL LOW (ref 21.8–53.1)
MCV: 93 fL — ABNORMAL HIGH (ref 79–92)
Mono # K/uL: 0.8 10*3/uL (ref 0.3–0.8)
Monocyte %: 9.3 % (ref 5.3–12.2)
Neut # K/uL: 5.8 10*3/uL — ABNORMAL HIGH (ref 1.8–5.4)
Platelets: 172 10*3/uL (ref 150–330)
RBC: 4.1 MIL/uL — ABNORMAL LOW (ref 4.6–6.1)
RDW: 13.4 % (ref 11.6–14.4)
Seg Neut %: 68.1 % — ABNORMAL HIGH (ref 34.0–67.9)
WBC: 8.6 10*3/uL (ref 4.2–9.1)

## 2012-10-27 LAB — HOLD SST

## 2012-10-27 NOTE — Progress Notes (Signed)
Utilization Management    Level of Care Inpatient as of the date 10/26/12      Rosaria Ferries, RN     Pager: [phone]

## 2012-10-27 NOTE — ED Notes (Signed)
Pt found smoking in unit bathroom. Writer spoke with patient and told pt smoking is not permitted and cannot happen again. Pt asked writer if he could go outside to smoke and stated he was told "by a staff member yesterday he go out accompanied by  staff and did so. Writer told pt I would not permit him to go out unless provider gives permission. Pt was fine with this plan.

## 2012-10-27 NOTE — ED Notes (Signed)
Pt spoke with Dr Nils Pyle about going outside to smoke. Dr Nils Pyle has given pt permission to go outside unattended to smoke once a day. Pt is aware of plan.

## 2012-10-27 NOTE — ED Notes (Signed)
Pt out to smoke with PCT.

## 2012-10-27 NOTE — Progress Notes (Addendum)
Attending physician progress note:    BP: (116-136)/(60-76)   Temp:  [36.1 C (97 F)-36.8 C (98.2 F)]   Temp src:  [-]   Heart Rate:  [62-64]   Resp:  [16-18]   SpO2:  [97 %-100 %]     Subjective: patient reports that he is feeling slightly better.  But continues to have significant pain in his right inguinal area    Lungs: Clear to auscultation.  No wheezing, crackles or rhonchi noted  Cardiovascular: Regular rate rhythm.  Normal S1-S2.    Abdomen: Soft, nontender and nondistended.  There is induration, warmth and tenderness to palpation over the right inguinal area with a small ulceration draining serosanguineous fluid (Large painful indurated "buboe").  Cellulitic area spreads from the right of his pubic involving the right inguinal area and medial aspect of right thigh  Extremities: No clubbing, cyanosis or edema.      Labs:    Recent Labs  Lab 10/27/12  0519 10/26/12  0507 10/25/12  2205   WBC 8.6 10.2* 12.0*   Hemoglobin 13.2* 12.6* 15.0   Hematocrit 39* 37* 43   Platelets 172 178 195                Labs:    Recent Labs  Lab 10/26/12  0507 10/25/12  2205   Sodium 135 138   Potassium 3.9 4.2   Chloride 104 101   CO2 23 27   UN 7 10   Creatinine 0.75 0.90   Glucose 103* 94   Calcium 8.2* 8.7*              Other labs:          Micro:   Aerobic Culture   Date Value Range Status   10/25/2012 Lab Cancel   Final        Bacterial Blood Culture   Date Value Range Status   10/25/2012 .   Preliminary            Active Hospital Problems    Diagnosis   . Tobacco abuse   . Cellulitis       Assessment and plan: This is a case of right inguinal areas cellulitis and small ulceration in  a 27 year old [place] American male who denies new sexual contact, penile discharge or risk factors for HIV.  On questioning patient did display some concern for sexually transmitted diseases and wanted to know you are checking for "sexually-transmitted diseases", are you not?Marland Kitchen    If patient consents, check HIV serology, RPR, titer for  LGV  PCR for HSV from the base of ulcer  Hepatitis B and C. serology  Urine nucleic acid acid amplification (NAAT) on urine for Chlamydia and gonorrhea  For now continue clindamycin  May Benefit from infectious disease consult if symptoms don't improve in next 24-48 hours  Continue warm compresses    Patient is very eager to smoke and does not want nicotine patch.  He was found smoking in the bathroom earlier by nursing staff  I told him that it is safer for him to call to smoke rather than smoke in hiding at the hospital.  I told him I will allow him to go out one time a day    Rexene Edison, MD FACP  Assistant Professor of Clinical Medicine / Hospitalist  Pager [phone]

## 2012-10-28 LAB — HEPATITIS B & C PROF
HBV Core Ab: POSITIVE — AB
HBV S Ab Quant: 734.71 m[IU]/mL
HBV S Ab: POSITIVE
HBV S Ag: NEGATIVE
Hep C Ab: NEGATIVE

## 2012-10-28 LAB — CBC AND DIFFERENTIAL
Baso # K/uL: 0 10*3/uL (ref 0.0–0.1)
Basophil %: 0.2 % (ref 0.2–1.2)
Eos # K/uL: 0.2 10*3/uL (ref 0.0–0.5)
Eosinophil %: 2.1 % (ref 0.8–7.0)
Hematocrit: 41 % (ref 40–51)
Hemoglobin: 14.1 g/dL (ref 13.7–17.5)
Lymph # K/uL: 2 10*3/uL (ref 1.3–3.6)
Lymphocyte %: 22.7 % (ref 21.8–53.1)
MCV: 93 fL — ABNORMAL HIGH (ref 79–92)
Mono # K/uL: 0.8 10*3/uL (ref 0.3–0.8)
Monocyte %: 8.6 % (ref 5.3–12.2)
Neut # K/uL: 5.8 10*3/uL — ABNORMAL HIGH (ref 1.8–5.4)
Platelets: 206 10*3/uL (ref 150–330)
RBC: 4.4 MIL/uL — ABNORMAL LOW (ref 4.6–6.1)
RDW: 13.3 % (ref 11.6–14.4)
Seg Neut %: 66.2 % (ref 34.0–67.9)
WBC: 8.8 10*3/uL (ref 4.2–9.1)

## 2012-10-28 LAB — SYPHILIS SCREEN
Syphilis Screen: NEGATIVE
Syphilis Status: NONREACTIVE

## 2012-10-28 LAB — HSV TYPES 1 AND 2 DNA PCR: HSV Types 1 & 2 DNA PCR: 0

## 2012-10-28 LAB — HIV 1&2 ANTIGEN/ANTIBODY: HIV 1&2 ANTIGEN/ANTIBODY: NONREACTIVE

## 2012-10-28 MED ORDER — HYDROCODONE-ACETAMINOPHEN 10-325 MG PO TABS *I*
1.0000 | ORAL_TABLET | ORAL | Status: DC | PRN
Start: 2012-10-28 — End: 2012-10-28
  Administered 2012-10-28 (×2): 1 via ORAL
  Filled 2012-10-28 (×2): qty 1

## 2012-10-28 MED ORDER — CLINDAMYCIN HCL 150 MG PO CAPS *I*
300.0000 mg | ORAL_CAPSULE | Freq: Four times a day (QID) | ORAL | Status: AC
Start: 2012-10-28 — End: 2012-11-07

## 2012-10-28 NOTE — Discharge Summary (Signed)
Name: [name] MRN: [id number] DOB: 05-15-1985     Admit Date: 10/26/2012   Date of Discharge: 10/28/2012    Discharge Attending Physician: Rexene Edison      Hospitalization Summary    CONCISE NARRATIVE: Admitted for right inguinal cellulitis.  Treated with IV Clindamycin with good results.  Testing for STDs completed.  Results pending.  Discharged to PCP follow up.      SIGNIFICANT MED CHANGES: Yes  Clindamycin, 300 mg PO qid x 10 days.      Signed: Evonnie Pat, PA  On: 10/28/2012  at: 2:04 PM

## 2012-10-28 NOTE — Discharge Instructions (Signed)
Your instructions:    1.  Please continue your antibiotics for another 10 days.  A prescription for this antibiotic has been sent to your Pharmacy.  2.  Please follow up with your Doctor for continued care.    Recommended diet: as tolerated    Recommended activity: activity as tolerated.     If you experience any of these symptoms after discharge:  Increased redness or swelling at site of cellulitis, wound.  please follow up with your Doctor.

## 2012-10-28 NOTE — Progress Notes (Signed)
Attending physician progress note:    BP: (115-149)/(64-88)   Temp:  [36 C (96.8 F)-37 C (98.6 F)]   Temp src:  [-]   Heart Rate:  [54-90]   Resp:  [16-18]   SpO2:  [97 %-100 %]     Subjective:  Feels better. Would like to go home.    Lungs: Clear to auscultation.  No wheezing, crackles or rhonchi noted  Cardiovascular: Regular rate rhythm.  Normal S1-S2.    Abdomen: Soft, nontender and nondistended.  There is induration, warmth and tenderness to palpation over the right inguinal area with a small ulceration draining serosanguineous fluid (Large painful indurated "buboe").  Cellulitic area spreads from the right of his pubic involving the right inguinal area and medial aspect of right thigh  Extremities: No clubbing, cyanosis or edema.      Labs:    Recent Labs  Lab 10/28/12  0618 10/27/12  0519 10/26/12  0507   WBC 8.8 8.6 10.2*   Hemoglobin 14.1 13.2* 12.6*   Hematocrit 41 39* 37*   Platelets 206 172 178             Syphilis screen negative  Hepatitis C negative  Hepatitis B serology consistent with resolved infection  HIV nonreactive     Labs:    Recent Labs  Lab 10/26/12  0507 10/25/12  2205   Sodium 135 138   Potassium 3.9 4.2   Chloride 104 101   CO2 23 27   UN 7 10   Creatinine 0.75 0.90   Glucose 103* 94   Calcium 8.2* 8.7*              Other labs:          Micro:   Aerobic Culture   Date Value Range Status   10/25/2012 Lab Cancel   Final        Bacterial Blood Culture   Date Value Range Status   10/25/2012 .   Preliminary            Active Hospital Problems    Diagnosis   . Tobacco abuse   . Cellulitis       Assessment and plan: This is a case of right inguinal areas cellulitis and small ulceration in  a 27 year old [place] American male who denies new sexual contact, penile discharge or risk factors for HIV.      Reviewed lab data with the patient  Ok to d/c on po clinda given improved symptoms   Continue warm compresses    F/u with pcp    Rexene Edison, MD FACP  Assistant Professor of Clinical Medicine  / Hospitalist  Pager [phone]

## 2012-10-28 NOTE — ED Notes (Signed)
Right groin drsg changed for small amt of pale sanguinous drainage. Heat pack applied as ordered.

## 2012-10-30 ENCOUNTER — Telehealth: Payer: Self-pay | Admitting: Internal Medicine

## 2012-10-30 LAB — BLOOD CULTURE: Bacterial Blood Culture: 0

## 2012-10-30 NOTE — Telephone Encounter (Signed)
Spoke with sister  Encouraged follow up with PCP

## 2012-10-31 LAB — GC CULTURE: GC Culture: 0

## 2013-05-25 ENCOUNTER — Emergency Department
Admission: EM | Admit: 2013-05-25 | Disposition: A | Discharge status: Home / Self Care | Payer: Self-pay | Source: Ambulatory Visit | Attending: Emergency Medicine | Admitting: Emergency Medicine

## 2013-05-25 ENCOUNTER — Encounter: Payer: Self-pay | Admitting: Emergency Medicine

## 2013-05-25 MED ORDER — TETANUS-DIPHTH-ACELL PERT 5-2.5-18.5 LF-MCG/0.5 IM SUSP *WRAPPED*
0.5000 mL | Freq: Once | INTRAMUSCULAR | Status: AC
Start: 2013-05-25 — End: 2013-05-25
  Administered 2013-05-25: 0.5 mL via INTRAMUSCULAR
  Filled 2013-05-25: qty 0.5

## 2013-05-25 MED ORDER — IBUPROFEN 400 MG PO TABS *I*
800.0000 mg | ORAL_TABLET | Freq: Once | ORAL | Status: AC
Start: 2013-05-25 — End: 2013-05-25
  Administered 2013-05-25: 800 mg via ORAL
  Filled 2013-05-25: qty 2

## 2013-05-25 NOTE — ED Provider Notes (Addendum)
History     Chief Complaint   Patient presents with    Numbness     HPI Comments: 28 y/o otherwise healthy male presenting for evaluation of "numbness," and hypesthesia in his right 5th finger, ~3 days after he pieced his right hypothenar eminence on the top of a chain-link fence.     No pain, fever/chills, skin redness, or drainage.     Last tetanus unknown.     Denies hitting head or other injuries.           History provided by:  Patient  Language interpreter used: No    Is this ED visit related to civilian activity for income:  Not work related      History reviewed. No pertinent past medical history.         History reviewed. No pertinent past surgical history.    History reviewed. No pertinent family history.      Social History      reports that he has never smoked. He does not have any smokeless tobacco history on file. He reports that he uses illicit drugs (Marijuana). He reports that he does not drink alcohol. His sexual activity history is not on file.    Living Situation    Questions Responses    Patient lives with     Homeless     Caregiver for other family member     External Services     Employment Unemployed    Domestic Violence Risk           Problem List     There is no problem list on file for this patient.      Review of Systems   Review of Systems   Constitutional: Negative for fever, chills, activity change and appetite change.   HENT: Negative for congestion, rhinorrhea, sore throat and trouble swallowing.    Eyes: Negative for pain and visual disturbance.   Respiratory: Negative for cough, chest tightness, shortness of breath and wheezing.    Cardiovascular: Negative for chest pain.   Gastrointestinal: Negative for nausea, vomiting and abdominal pain.   Genitourinary: Negative for dysuria.   Musculoskeletal: Negative for back pain, neck pain and neck stiffness.   Skin: Positive for wound. Negative for rash.   Neurological: Positive for numbness. Negative for dizziness, seizures, syncope,  weakness, light-headedness and headaches.   Hematological: Does not bruise/bleed easily.   Psychiatric/Behavioral: Negative for agitation.       Physical Exam       ED Triage Vitals   BP Heart Rate Heart Rate(via Pulse Ox) Resp Temp Temp Source SpO2 O2 Device O2 Flow Rate   05/25/13 1541 05/25/13 1541 -- 05/25/13 1541 05/25/13 1541 05/25/13 1541 05/25/13 1541 05/25/13 1733 --   119/84 mmHg 91  16 36.4 C (97.5 F) TEMPORAL 98 % None (Room air)       Weight           05/25/13 1541           95.255 kg (210 lb)               Physical Exam   Constitutional: He is oriented to person, place, and time. He appears well-developed and well-nourished. He is cooperative. He does not have a sickly appearance. He does not appear ill. No distress.   HENT:   Head: Normocephalic and atraumatic.   Nose: Nose normal.   Mouth/Throat: Oropharynx is clear and moist and mucous membranes are normal.   Eyes: Conjunctivae  and lids are normal. Pupils are equal, round, and reactive to light.   Neck: Normal range of motion. Neck supple. No spinous process tenderness and no muscular tenderness present.   Cardiovascular: Normal rate, regular rhythm, S1 normal, S2 normal and normal heart sounds.  Exam reveals no gallop.    No murmur heard.  Pulmonary/Chest: Effort normal and breath sounds normal. He has no decreased breath sounds. He has no wheezes. He has no rhonchi. He has no rales.   Abdominal: Soft. Normal appearance. He exhibits no distension. There is no tenderness.   Musculoskeletal:   Right hand. No bony tenderness. Full ROM to flex/ext of all 5 fingers. No weakness in finger abduction.     Flexor sheaths non-tender,no pain with passive extension, no finger edema.      Lymphadenopathy:     He has no cervical adenopathy.        Right: No supraclavicular adenopathy present.        Left: No supraclavicular adenopathy present.   Neurological: He is alert and oriented to person, place, and time. No cranial nerve deficit.   Light touch sensation  present but diminished in right pinky finger compared with left. Right fourth finger has intact sensation.    Skin: Skin is warm and dry. No rash noted.   2 mm scab over the mid hypothenar eminence with no palpable fluctuance, erythema, warmth, drainage, tenderness, or induration.        Psychiatric: He has a normal mood and affect. His speech is normal and behavior is normal. Judgment and thought content normal. Cognition and memory are normal.   Nursing note and vitals reviewed.      Medical Decision Making      Amount and/or Complexity of Data Reviewed  Clinical lab tests: ordered and reviewed  Tests in the radiology section of CPT: ordered and reviewed  Review and summarize past medical records: yes  Independent visualization of images, tracings, or specimens: yes        Initial Evaluation:  ED First Provider Contact    Date/Time Event User Comments    05/25/13 1547 ED Provider First Contact KREBS, DOLORES Initial Face to Face Provider Contact          Patient seen by me today 05/25/2013 at 17:50.     Assessment:  28 y.o., male comes to the ED with right hand hypothenar wound and distal hypesthesia. No evidence of infection or tenosynovitis. Patient's tetanus is out of date.     Differential Diagnosis includes: Abscess, laceration, contusion, seroma, hematoma, retained foreign body.      Plan:   - Tdap  - Xray right hand - negative for fracture or foreign body.   - Reassurance  - Return precautions for infection   - Discharge home       Donato Schultzole Klick, MD        Donato SchultzKlick, Cole, MD  Resident  05/25/13 [phone]    Resident Attestation:     Patient seen by me on arrival date of 05/25/2013 at 5:47 PM    History:   I reviewed this patient, reviewed the resident's note with the above and below addendums  Exam:   I examined this patient, reviewed the resident's note with the above and below addendums    Decision Making:   I discussed with the resident his/her documented decision making with the above and below  addendums.    HPI:  28 year old male presents the ED with chief complaint of right finger numbness.  Patient states that a couple days ago he was jumping a fence when he sustained a puncture wound to the palm of the right hand.  Patient states there has been no swelling or drainage.  Patient states though that he has developed some numbness along the outside of the left pinky.  Patient states she's able to move the finger without difficulty.  Patient states there is only a slight decrease in sensation.  Patient denies any fevers or chills.  Patient denies any nausea vomiting.  Patient has any weakness.  Patient has any recent illness.    Phys:  Gen: Well appearing. No distress, Appears comfortable   HEENT: NCAT, MMM, Pharynx with no erythema or exudate. No nasal discharge, No eye discharge   CV: RRR, No MRG, +S1S2   Resp: CTA B/L, No RRW, Good effort   Abd: Soft, Non Tender, Non Distended, BS x 4, No rebound, Rigidity, or Guarding   Ext: There is a puncture wound on the palm of the right hand in the middle just below the right fifth finger.  There is no induration or fluctuance noted around the puncture site.    Neuro: Alert, Following commands, Moving all 4 exts    Plan:  I agree with the work up and the evaluation of the resident.     Pt will have tetanus, right hand x-ray, Reassurance, Reevaluation. Ultimate disposition pending ED work up and symptom resolution. Pt will likely be d/c home in the setting of normal work up and resolution of symptoms. Pt will be encouraged to follow up with their PCP for further evaluation of continuing symptoms.    Author Clayton Lefort, DO      Oval Cavazos, DO  05/25/13 [phone]

## 2013-05-25 NOTE — ED Notes (Signed)
Pt presents to the ED for numbness of right hand 5th finger. States that he was jumping a fence and sustained a puncture wound to right hand near 5th finger. Now numb and tingling. Denies cp, sob, or abd pain. To room for evaluation.

## 2013-05-25 NOTE — First Provider Contact (Signed)
ED Medical Screening Exam Note    Initial provider evaluation performed by   ED First Provider Contact    Date/Time Event User Comments    05/25/13 1547 ED Provider First Contact [name] Initial Face to Face Provider Contact          Patrick PennaJames Vega is a 28 y.o. male with a history of No past medical history on file. with   Chief Complaint   Patient presents with    Numbness   .    CC: right 5th finger numbness HPI: The patient states his finger has been numb for 4 days. He has full rom, but denies trauma.       Orders placed:  XRAYS     Patient requires further evaluation.     Patrick Gardella A Nature Vogelsang, NP, 05/25/2013, 3:47 PM

## 2013-05-25 NOTE — Discharge Instructions (Signed)
You were seen in the ER for a wound to the hand. It does not appear to be infected at this time. You likely damaged a superficial nerve that will grow back in 1-2 mo.     You may rinse the area but do not scrub it and avoid using soap or other detergents on the laceration.    You can apply vaseline or antibiotic ointment to the laceration daily to keep it from drying out.     RETURN TO THE ER IF: you develop worsening pain, expanding redness, pus-like drainage, fever >100.5 F, or for any other worrisome symptoms.

## 2013-05-25 NOTE — ED Notes (Signed)
4 days numbness/tingling to R fifth finger, ROM intact.  Denies injury.

## 2013-05-25 NOTE — ED Notes (Signed)
Pt signed and stated that he understood his discharge information, pt will be going home with a friend.

## 2014-09-19 ENCOUNTER — Encounter: Payer: Self-pay | Admitting: Plastic Surgery

## 2014-09-19 ENCOUNTER — Emergency Department
Admission: EM | Admit: 2014-09-19 | Disposition: A | Discharge status: Home / Self Care | Payer: Self-pay | Source: Ambulatory Visit

## 2014-09-19 MED ORDER — OXYCODONE HCL 5 MG PO CAPS *A*
10.0000 mg | ORAL_CAPSULE | Freq: Three times a day (TID) | ORAL | 0 refills | Status: DC | PRN
Start: 2014-09-19 — End: 2021-12-28

## 2014-09-19 MED ORDER — HYDROMORPHONE HCL PF 1 MG/ML IJ SOLN *WRAPPED*
1.0000 mg | Freq: Once | INTRAMUSCULAR | Status: AC
Start: 2014-09-19 — End: 2014-09-19
  Administered 2014-09-19: 1 mg via INTRAMUSCULAR
  Filled 2014-09-19: qty 1

## 2014-09-19 MED ORDER — OXYCODONE HCL 5 MG PO TABS *I*
5.0000 mg | ORAL_TABLET | Freq: Once | ORAL | Status: AC
Start: 2014-09-19 — End: 2014-09-19
  Administered 2014-09-19: 5 mg via ORAL
  Filled 2014-09-19: qty 1

## 2014-09-19 MED ORDER — ONDANSETRON HCL 2 MG/ML IV SOLN *I*
4.0000 mg | Freq: Once | INTRAMUSCULAR | Status: AC
Start: 2014-09-19 — End: 2014-09-19
  Administered 2014-09-19: 4 mg via INTRAMUSCULAR
  Filled 2014-09-19: qty 2

## 2014-09-19 MED ORDER — BACITRACIN-POLYMYXIN B 500-10000 UNIT/GM EX OINT *I*
TOPICAL_OINTMENT | Freq: Once | CUTANEOUS | Status: AC
Start: 2014-09-19 — End: 2014-09-19
  Administered 2014-09-19: 1 g via TOPICAL
  Filled 2014-09-19: qty 28.35
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
## 2014-09-19 NOTE — Discharge Instructions (Addendum)
Burn Wound Care Instructions   Perform wound care daily.   Wash the wound(s) daily with gentle soap. Gently scrub open areas with a clean wash cloth or coarse gauze to remove the overlying layer of filmy buildup. If this "biofilm" is not washed away, it traps bacteria and may lead to infection. Pat dry with clean towel or paper towel.   For the body and extremities: Apply a very generous amount of antibiotic ointment (Polysporin, Neosporin, Bacitracin, etc.) to the open areas. Lay a sheet of Xeroform (yellow greasy gauze) over the entire open area. Wrap extremities with gauze roll loosely then wrap with ACE bandage, or cover areas on torso with large gauze and secure with gentle tape. (When wrapping with ACE bandage, start at the point furthest away from your body and wrap toward your body with an overlap of about 1/3-1/2 of the ACE bandage.)   Smoking can reduce the quality of wound healing and increase chances of wound infections, as well as increase chances of developing chronic health problems or worsen conditions you already have. If you smoke, you should quit. Smoking cessation information is available for your review to help you quit. Medications to help you quit are available. Ask your doctor (Unknown, Provider) if you would like to receive these medications.   If you are diabetic, check your blood sugar at least daily or as directed by your physician. Poor blood sugar control can lead to delayed wound healing and infections. Follow up with Unknown, Provider to discuss your diabetic care regimen.   It is important to eat a high protein healthy diet to provide the nutrients required to heal.   Follow up in Burn Surgery clinic as directed. We are located on the second floor of the [facility]. The phone number is [phone]).    Author: Ihor Austin, MD as of: 09/19/2014  at: 11:40 PM       A prescription for pain medication has been sent to the 24 hr pharmacy for you  at [address] that you can pick up tonight
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
## 2014-09-22 ENCOUNTER — Telehealth: Payer: Self-pay | Admitting: Student in an Organized Health Care Education/Training Program

## 2014-09-22 ENCOUNTER — Other Ambulatory Visit: Payer: Self-pay | Admitting: Student in an Organized Health Care Education/Training Program

## 2014-09-22 NOTE — Telephone Encounter (Signed)
Patient called regarding scheduling a clinic follow-up appointment. I verified that the patient had the correct phone number and asked that he call back during business hours tomorrow to schedule his appointment. He agreed.

## 2014-09-23 ENCOUNTER — Ambulatory Visit: Payer: Self-pay | Admitting: Surgery

## 2014-09-23 ENCOUNTER — Encounter: Payer: Self-pay | Admitting: Surgery

## 2014-09-23 ENCOUNTER — Other Ambulatory Visit: Payer: Self-pay

## 2014-09-23 VITALS — BP 132/74 | HR 82 | Temp 96.8°F | Resp 17 | Ht 71.0 in | Wt 256.0 lb

## 2014-09-23 DIAGNOSIS — T2220XA Burn of second degree of shoulder and upper limb, except wrist and hand, unspecified site, initial encounter: Secondary | ICD-10-CM

## 2014-09-23 DIAGNOSIS — T2121XA Burn of second degree of chest wall, initial encounter: Secondary | ICD-10-CM

## 2014-09-23 DIAGNOSIS — T23201A Burn of second degree of right hand, unspecified site, initial encounter: Secondary | ICD-10-CM

## 2014-09-23 MED ORDER — OXYCODONE HCL 5 MG PO CAPS *A*
5.0000 mg | ORAL_CAPSULE | Freq: Four times a day (QID) | ORAL | 0 refills | Status: DC | PRN
Start: 2014-09-23 — End: 2021-12-28

## 2014-09-23 NOTE — Comprehensive Assessment (Signed)
09/23/14 1609   DSRIP intervention status   DSRIP Intervention status Outreach intervention   DSRIP Patient? Yes   Patient Barriers no PCP;uninsured   Patient address confirmation Yes   Patient phone confirmation Yes   Patient insurance confirmation Yes   Patient [place] referred? Yes   Patient PCP confirmation Yes   PCP Appt type New patient appt   Follow up appointment center/address: [facility] [address] [place] [address]   Patient followup provider dr Fayrene Fearing   [facility] Patient followup appointment date 09/30/14   Patient followup appointment time 1510   Patient transport plan self   CHW handoff to involved agency? No   FCM Patient application complete? Approved   FCM Application approved? Medicaid Managed Care   DSRIP Medicaid Managed Care Blue Choice Option   PAM completed? No   Time Spent with Patient (min) 30   [facility]  [employer]   [phone]

## 2014-09-24 ENCOUNTER — Encounter: Payer: Self-pay | Admitting: Surgery
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
## 2014-09-30 ENCOUNTER — Ambulatory Visit: Payer: Self-pay | Admitting: Student in an Organized Health Care Education/Training Program

## 2014-09-30 ENCOUNTER — Ambulatory Visit: Payer: Self-pay

## 2015-06-19 ENCOUNTER — Emergency Department
Admission: EM | Admit: 2015-06-19 | Disposition: A | Discharge status: Home / Self Care | Payer: Self-pay | Source: Ambulatory Visit

## 2015-06-19 ENCOUNTER — Encounter: Payer: Self-pay | Admitting: Emergency Medicine

## 2015-06-19 NOTE — ED Triage Notes (Signed)
Atraumatic outer right leg pain on/off x 1 month.       Triage Note   Angela BurkeMelissa Ravneet Spilker, RN

## 2015-06-19 NOTE — ED Notes (Signed)
Received report, assumed care of patient at this time. Pt resting in bed, NAD. Will cont to monitor and treat per provider orders.

## 2015-06-19 NOTE — ED Notes (Signed)
Presents with right leg pain that radiates from ankle to knee.  Denies injury.

## 2015-06-19 NOTE — ED Notes (Signed)
Pt [facility] stating he has to go get his children on the bus. Pt stated that he will be back later. Pt had no IV, refused VS prior to walking out. Ambulatory on exit.

## 2016-10-21 ENCOUNTER — Emergency Department (HOSPITAL_COMMUNITY)
Admission: EM | Admit: 2016-10-21 | Discharge: 2016-10-21 | Disposition: A | Discharge status: Home / Self Care | Payer: Self-pay | Attending: Emergency Medicine | Admitting: Emergency Medicine

## 2016-10-21 ENCOUNTER — Emergency Department (HOSPITAL_COMMUNITY): Payer: Self-pay

## 2016-10-21 ENCOUNTER — Encounter (HOSPITAL_COMMUNITY): Payer: Self-pay | Admitting: *Deleted

## 2016-10-21 DIAGNOSIS — M25511 Pain in right shoulder: Secondary | ICD-10-CM | POA: Insufficient documentation

## 2016-10-21 DIAGNOSIS — F17200 Nicotine dependence, unspecified, uncomplicated: Secondary | ICD-10-CM | POA: Insufficient documentation

## 2016-10-21 HISTORY — DX: Obesity, unspecified: E66.9

## 2016-10-21 MED ORDER — CYCLOBENZAPRINE HCL 10 MG PO TABS: 10.0000 mg | ORAL_TABLET | Freq: Two times a day (BID) | ORAL | 0 refills | Status: DC | PRN

## 2016-10-21 MED ORDER — NAPROXEN 500 MG PO TABS: 500.0000 mg | ORAL_TABLET | Freq: Two times a day (BID) | ORAL | 0 refills | Status: DC

## 2016-10-21 NOTE — ED Provider Notes (Signed)
[facility]EMER[facility]ENCY DEPT Provider Note   CSN: [id number] Arrival date & time: 10/21/16  0813     History   Chief Complaint Chief Complaint  Patient presents with  . Shoulder Pain    HPI Joseph Lutz is a 31 y.o. male who presents to the emergency department with constant right shoulder pain that began at 4-5 days ago when he awoke. He denies known trauma or injury. He describes the pain as achy. He reports he first that he slept on the shoulder wrong, but the pain did not improve over the last few days. no aggravating or alleviating factors. He reports taking 2 tablets of Tylenol around 3-4 AM this morning without improvement. He denies fever, chills, left shoulder pain, back pain, or neck pain. No weakness or numbness.  No history of surgery or previous injury to the shoulder.   The history is provided by the patient. No language interpreter was used.    Past Medical History:  Diagnosis Date  . Obesity     There are no active problems to display for this patient.   History reviewed. No pertinent surgical history.     Home Medications    Prior to Admission medications   Medication Sig Start Date End Date Taking? Authorizing Provider  cyclobenzaprine (FLEXERIL) 10 MG tablet Take 1 tablet (10 mg total) by mouth 2 (two) times daily as needed for muscle spasms. 10/21/16   Naina Sleeper A, PA-C  naproxen (NAPROSYN) 500 MG tablet Take 1 tablet (500 mg total) by mouth 2 (two) times daily. 10/21/16   Jude Linck, Coral ElseMia A, PA-C    Family History History reviewed. No pertinent family history.  Social History Social History  Substance Use Topics  . Smoking status: Current Every Day Smoker  . Smokeless tobacco: Not on file  . Alcohol use Yes     Allergies   Patient has no known allergies.   Review of Systems Review of Systems  Constitutional: Negative for chills and fever.  Musculoskeletal: Positive for arthralgias and myalgias. Negative for back pain, joint swelling and neck  pain.  Neurological: Negative for weakness and numbness.     Physical Exam Updated Vital Signs BP 113/77 (BP Location: Left Arm)   Pulse 80   Temp 98 F (36.7 C) (Oral)   Resp 17   SpO2 100%   Physical Exam  Constitutional: He appears well-developed.  HENT:  Head: Normocephalic.  Eyes: Conjunctivae are normal.  Neck: Neck supple.  Cardiovascular: Normal rate and regular rhythm.   No murmur heard. Pulmonary/Chest: Effort normal.  Abdominal: Soft. He exhibits no distension.  Musculoskeletal: Normal range of motion. He exhibits tenderness. He exhibits no deformity.  Full active and passive range of motion of the bilateral shoulders, elbows, wrists. Radial pulses are 2+ and symmetric. Sensation is intact throughout the bilateral upper extremities. 5 out of 5 strength against resistance of the large muscle groups of the bilateral upper extremities. Negative empty can test. Tender to palpation over the posterior aspect of the right shoulder. No pain with passive range of motion. Increased pain with active range of motion. No overlying rash, erythema, edema, or warmth.  Neurological: He is alert.  Skin: Skin is warm and dry.  Psychiatric: His behavior is normal.  Nursing note and vitals reviewed.    ED Treatments / Results  Labs (all labs ordered are listed, but only abnormal results are displayed) Labs Reviewed - No data to display  EKG  EKG Interpretation None  Radiology Dg Shoulder Right  Result Date: 10/21/2016 CLINICAL DATA:  Right anterior shoulder pain x Mon, "feel like it in my shoulder joint", no known injury, decreased range of motion. EXAM: RIGHT SHOULDER - 2+ VIEW COMPARISON:  None. FINDINGS: There is no evidence of fracture or dislocation. There is no evidence of arthropathy or other focal bone abnormality. Soft tissues are unremarkable. IMPRESSION: Negative. Electronically Signed   By: Norva Pavlov M.D.   On: 10/21/2016 09:17    Procedures Procedures  (including critical care time)  Medications Ordered in ED Medications - No data to display   Initial Impression / Assessment and Plan / ED Course  I have reviewed the triage vital signs and the nursing notes.  Pertinent labs & imaging results that were available during my care of the patient were reviewed by me and considered in my medical decision making (see chart for details).    Patient X-Ray negative for obvious fracture or dislocation. No suspicion for septic joint or gout at this time. Pt advised to follow up with PCP if symptoms persist. Patient given brace while in ED, conservative therapy recommended and discussed. Patient will be dc home & is agreeable with above plan.  Final Clinical Impressions(s) / ED Diagnoses   Final diagnoses:  Acute pain of right shoulder    New Prescriptions New Prescriptions   CYCLOBENZAPRINE (FLEXERIL) 10 MG TABLET    Take 1 tablet (10 mg total) by mouth 2 (two) times daily as needed for muscle spasms.   NAPROXEN (NAPROSYN) 500 MG TABLET    Take 1 tablet (500 mg total) by mouth 2 (two) times daily.     Barkley Boards, PA-C 10/21/16 1021    Bethann Berkshire, MD 10/24/16 1616

## 2016-10-21 NOTE — Discharge Instructions (Signed)
Please take 1 tablet of naproxen twice a day for the next 7 days. Please take this medication with food to avoid stomach upset. You may also take Flexeril up to 2 times per day to help with muscle spasms and pain. Please do not take this medication if you are working or driving because it can make you sleepy. Please apply ice to your shoulder 3-4 times a day for 15-20 minutes for the next week. You may wear the sling as needed for comfort.  If your symptoms do not start to improve over the next week, please call [facility] and let them know you're following up from a visit in the emergency department.   If you develop new or worsening symptoms, including a fever, if the joint gets red, hot, and swollen, or if you have a fall or injury to the shoulder, please return to the emergency department for reevaluation.

## 2016-10-21 NOTE — ED Triage Notes (Signed)
Pt reports right shoulder pain for several days, pain when lifting his arm. Denies any specific injury.

## 2016-10-21 NOTE — ED Notes (Signed)
Pt states woke up with  Rt shoulder pain x 3 days denies any injury or sleeping on it wrong, pt states hurts to lift arm

## 2017-11-08 ENCOUNTER — Encounter: Payer: Self-pay | Admitting: Emergency Medicine

## 2018-06-14 ENCOUNTER — Emergency Department: Payer: Self-pay

## 2018-06-14 ENCOUNTER — Other Ambulatory Visit: Payer: Self-pay

## 2018-06-14 ENCOUNTER — Emergency Department
Admission: EM | Admit: 2018-06-14 | Discharge: 2018-06-14 | Disposition: A | Discharge status: Home / Self Care | Payer: Self-pay | Attending: Emergency Medicine | Admitting: Emergency Medicine

## 2018-06-14 DIAGNOSIS — R0600 Dyspnea, unspecified: Secondary | ICD-10-CM | POA: Insufficient documentation

## 2018-06-14 DIAGNOSIS — F17210 Nicotine dependence, cigarettes, uncomplicated: Secondary | ICD-10-CM | POA: Insufficient documentation

## 2018-06-14 LAB — TROPONIN I: Troponin I: <0.03 ng/mL (ref <0.03)

## 2018-06-14 LAB — CBC
HCT: 43.4 % (ref 39.0–52.0)
Hemoglobin: 15.3 g/dL (ref 13.0–17.0)
MCH: 31.9 pg (ref 26.0–34.0)
MCHC: 35.3 g/dL (ref 30.0–36.0)
MCV: 90.4 fL (ref 80.0–100.0)
Platelets: 218 10*3/uL (ref 150–400)
RBC: 4.8 MIL/uL (ref 4.22–5.81)
RDW: 13.1 % (ref 11.5–15.5)
WBC: 7.2 10*3/uL (ref 4.0–10.5)
nRBC: 0 % (ref 0.0–0.2)

## 2018-06-14 LAB — BASIC METABOLIC PANEL
Anion gap: 7 (ref 5–15)
BUN: 17 mg/dL (ref 6–20)
CO2: 23 mmol/L (ref 22–32)
Calcium: 9.1 mg/dL (ref 8.9–10.3)
Chloride: 108 mmol/L (ref 98–111)
Creatinine, Ser: 0.98 mg/dL (ref 0.61–1.24)
GFR calc Af Amer: >60 mL/min (ref >60)
GFR calc non Af Amer: >60 mL/min (ref >60)
Glucose, Bld: 116 mg/dL — ABNORMAL HIGH (ref 70–99)
Potassium: 4.3 mmol/L (ref 3.5–5.1)
Sodium: 138 mmol/L (ref 135–145)

## 2018-06-14 NOTE — ED Notes (Signed)
Patient denies pain and is resting comfortably.  

## 2018-06-14 NOTE — ED Triage Notes (Signed)
C/o sob that began this AM after getting off of work, becoming anxious due to SOB and tingling of extremities followed. Denies cough, congestion, fever or sore throat. Denies cp. Pt alert and oriented X4, active, cooperative, pt in NAD. RR even and unlabored, color WNL.

## 2018-06-14 NOTE — ED Provider Notes (Signed)
[facility] Emergency Department Provider Note  Time seen: 9:31 AM  I have reviewed the triage vital signs and the nursing notes.   HISTORY  Chief Complaint Shortness of Breath and Anxiety   HPI Joseph Lutz is a [age] y.o. male with no significant past medical history presents to the emergency department for shortness of breath.  According to the patient since this morning he has been feeling short of breath, describes a chest tightness.  Denies any "pain."  Denies any fever or cough.  Denies any recent travel or known sick exposure.  Currently the patient appears well, satting 98 to 100% on room air during my evaluation.  Past Medical History:  Diagnosis Date  . Obesity     There are no active problems to display for this patient.   History reviewed. No pertinent surgical history.  Prior to Admission medications   Medication Sig Start Date End Date Taking? Authorizing Provider  cyclobenzaprine (FLEXERIL) 10 MG tablet Take 1 tablet (10 mg total) by mouth 2 (two) times daily as needed for muscle spasms. 10/21/16   McDonald, Mia A, PA-C  naproxen (NAPROSYN) 500 MG tablet Take 1 tablet (500 mg total) by mouth 2 (two) times daily. 10/21/16   McDonald, Mia A, PA-C    No Known Allergies  No family history on file.  Social History Social History   Tobacco Use  . Smoking status: Current Every Day Smoker  Substance Use Topics  . Alcohol use: Yes  . Drug use: No    Review of Systems Constitutional: Negative for fever. ENT: Negative for recent illness/congestion Cardiovascular: Negative for chest pain. Respiratory: Mild shortness of breath since this morning Gastrointestinal: Negative for abdominal pain Musculoskeletal: Negative for musculoskeletal complaints Skin: Negative for skin complaints  Neurological: Negative for headache All other ROS negative  ____________________________________________   PHYSICAL EXAM:  VITAL SIGNS: ED Triage Vitals   Enc Vitals Group     BP 06/14/18 0913 (!) 158/76     Pulse Rate 06/14/18 0913 (!) 109     Resp 06/14/18 0913 20     Temp 06/14/18 0913 98.1 F (36.7 C)     Temp Source 06/14/18 0913 Oral     SpO2 06/14/18 0913 100 %     Weight 06/14/18 0914 275 lb (124.7 kg)     Height 06/14/18 0914 5\' 11"  (1.803 m)     Head Circumference --      Peak Flow --      Pain Score 06/14/18 0914 0     Pain Loc --      Pain Edu? --      Excl. in [place]? --    Constitutional: Alert and oriented. Well appearing and in no distress. Eyes: Normal exam ENT      Head: Normocephalic and atraumatic.      Mouth/Throat: Mucous membranes are moist. Cardiovascular: Normal rate, regular rhythm.  Respiratory: Normal respiratory effort without tachypnea nor retractions. Breath sounds are clear Gastrointestinal: Soft and nontender. No distention.  Musculoskeletal: Nontender with normal range of motion in all extremities.  Neurologic:  Normal speech and language. No gross focal neurologic deficits Skin:  Skin is warm, dry and intact.  Psychiatric: Mood and affect are normal.  ____________________________________________    EKG  EKG viewed and interpreted by myself shows sinus tachycardia at 112 bpm with a narrow QRS, normal axis, normal intervals, no concerning ST changes.  ____________________________________________    RADIOLOGY  X-rays negative  ____________________________________________   INITIAL  IMPRESSION / ASSESSMENT AND PLAN / ED COURSE  Pertinent labs & imaging results that were available during my care of the patient were reviewed by me and considered in my medical decision making (see chart for details).   Patient presents to the emergency department for shortness of breath since this morning.  Overall the patient appears well satting 98 to 100% on room air, no cough or fever.  No travel or sick exposure.  We will check labs, chest x-ray and EKG.  Patient agreeable plan of care.  Patient's x-ray  is negative, labs are reassuring including negative troponin.  Pulse rate is now 61.  Overall patient appears extremely well, reassured by work-up.  Discussed return precautions.  Joseph Lutz was evaluated in Emergency Department on 06/14/2018 for the symptoms described in the history of present illness. He was evaluated in the context of the global COVID-19 pandemic, which necessitated consideration that the patient might be at risk for infection with the SARS-CoV-2 virus that causes COVID-19. Institutional protocols and algorithms that pertain to the evaluation of patients at risk for COVID-19 are in a state of rapid change based on information released by regulatory bodies including the CDC and federal and state organizations. These policies and algorithms were followed during the patient's care in the ED.  ____________________________________________   FINAL CLINICAL IMPRESSION(S) / ED DIAGNOSES  Dyspnea   Minna Antis, MD 06/14/18 1056

## 2018-06-14 NOTE — ED Notes (Signed)
This RN woke patient up and requested that patient call his father after speaking with first RN. Pt awoke and stated, "yeah I just got off the phone with him".

## 2018-09-27 ENCOUNTER — Encounter: Payer: Self-pay | Admitting: Emergency Medicine

## 2018-09-27 ENCOUNTER — Emergency Department
Admission: EM | Admit: 2018-09-27 | Discharge: 2018-09-27 | Disposition: A | Discharge status: Home / Self Care | Payer: Self-pay | Attending: Emergency Medicine | Admitting: Emergency Medicine

## 2018-09-27 ENCOUNTER — Other Ambulatory Visit: Payer: Self-pay

## 2018-09-27 DIAGNOSIS — Z202 Contact with and (suspected) exposure to infections with a predominantly sexual mode of transmission: Secondary | ICD-10-CM | POA: Insufficient documentation

## 2018-09-27 DIAGNOSIS — F17200 Nicotine dependence, unspecified, uncomplicated: Secondary | ICD-10-CM | POA: Insufficient documentation

## 2018-09-27 LAB — CHLAMYDIA/NGC RT PCR (ARMC ONLY): N gonorrhoeae: NOT DETECTED

## 2018-09-27 LAB — CHLAMYDIA/NGC RT PCR (ARMC ONLY)??????????: Chlamydia Tr: NOT DETECTED

## 2018-09-27 MED ORDER — METRONIDAZOLE 500 MG PO TABS: 500.0000 mg | ORAL_TABLET | Freq: Two times a day (BID) | ORAL | 0 refills | Status: AC

## 2018-09-27 NOTE — ED Provider Notes (Signed)
[facility] Emergency Department Provider Note  ____________________________________________   None    (approximate)  I have reviewed the triage vital signs and the nursing notes.   HISTORY  Chief Complaint Exposure to STD    HPI Jcion Buddenhagen is a 33 y.o. male presents emergency department stating his girlfriend was diagnosed with trichomoniasis.  He states that she tested negative for gonorrhea and chlamydia.  She had gone to her doctor for pregnancy test and they tested her for all of the above.  He denies any burning with urination or penile discharge.  He denies any fever or chills.    Past Medical History:  Diagnosis Date  . Obesity     There are no active problems to display for this patient.   History reviewed. No pertinent surgical history.  Prior to Admission medications   Medication Sig Start Date End Date Taking? Authorizing Provider  metroNIDAZOLE (FLAGYL) 500 MG tablet Take 1 tablet (500 mg total) by mouth 2 (two) times daily for 7 days. 09/27/18 10/04/18  Versie Starks, PA-C    Allergies Patient has no known allergies.  No family history on file.  Social History Social History   Tobacco Use  . Smoking status: Current Every Day Smoker  . Smokeless tobacco: Never Used  Substance Use Topics  . Alcohol use: Yes  . Drug use: No    Review of Systems  Constitutional: No fever/chills Eyes: No visual changes. ENT: No sore throat. Respiratory: Denies cough Genitourinary: Negative for dysuria.  Positive STD exposure Musculoskeletal: Negative for back pain. Skin: Negative for rash.    ____________________________________________   PHYSICAL EXAM:  VITAL SIGNS: ED Triage Vitals  Enc Vitals Group     BP 09/27/18 1207 130/87     Pulse Rate 09/27/18 1207 78     Resp 09/27/18 1207 16     Temp 09/27/18 1207 98.5 F (36.9 C)     Temp Source 09/27/18 1207 Oral     SpO2 09/27/18 1207 100 %     Weight 09/27/18 1204 274  lb 14.6 oz (124.7 kg)     Height 09/27/18 1204 5\' 11"  (1.803 m)     Head Circumference --      Peak Flow --      Pain Score 09/27/18 1204 0     Pain Loc --      Pain Edu? --      Excl. in [place]? --     Constitutional: Alert and oriented. Well appearing and in no acute distress. Eyes: Conjunctivae are normal.  Head: Atraumatic. Nose: No congestion/rhinnorhea. Mouth/Throat: Mucous membranes are moist.   Neck:  supple no lymphadenopathy noted Cardiovascular: Normal rate, regular rhythm.  Respiratory: Normal respiratory effort.  No retractions,  GU: deferred Musculoskeletal: FROM all extremities, warm and well perfused Neurologic:  Normal speech and language.  Skin:  Skin is warm, dry and intact. No rash noted. Psychiatric: Mood and affect are normal. Speech and behavior are normal.  ____________________________________________   LABS (all labs ordered are listed, but only abnormal results are displayed)  Labs Reviewed  CHLAMYDIA/NGC RT PCR ([facility] ONLY)   ____________________________________________   ____________________________________________  RADIOLOGY    ____________________________________________   PROCEDURES  Procedure(s) performed: No  Procedures    ____________________________________________   INITIAL IMPRESSION / ASSESSMENT AND PLAN / ED COURSE  Pertinent labs & imaging results that were available during my care of the patient were reviewed by me and considered in my medical decision making (see  chart for details).   Patient is 33 year old male presents emergency department concerns of trichomoniasis.  His girlfriend was recently tested for STDs and was positive for this.  Physical exam patient appears well.  Remainder of exam is unremarkable  GC/chlamydia test pending. Discussed treatment with the patient.  He states he would rather take the Flagyl for 1 week instead of doing 2 g all at 1 time.  [facility]  We will call him with the GC/chlamydia results.  He was discharged stable condition.    Leticia PennaJames Eberwein was evaluated in Emergency Department on 09/27/2018 for the symptoms described in the history of present illness. He was evaluated in the context of the global COVID-19 pandemic, which necessitated consideration that the patient might be at risk for infection with the SARS-CoV-2 virus that causes COVID-19. Institutional protocols and algorithms that pertain to the evaluation of patients at risk for COVID-19 are in a state of rapid change based on information released by regulatory bodies including the CDC and federal and state organizations. These policies and algorithms were followed during the patient's care in the ED.   As part of my medical decision making, I reviewed the following data within the electronic MEDICAL RECORD NUMBER Nursing notes reviewed and incorporated, Old chart reviewed, Notes from prior ED visits and [place] Controlled Substance Database  ____________________________________________   FINAL CLINICAL IMPRESSION(S) / ED DIAGNOSES  Final diagnoses:  STD exposure      NEW MEDICATIONS STARTED DURING THIS VISIT:  New Prescriptions   METRONIDAZOLE (FLAGYL) 500 MG TABLET    Take 1 tablet (500 mg total) by mouth 2 (two) times daily for 7 days.     Note:  This document was prepared using Dragon voice recognition software and may include unintentional dictation errors.    Faythe GheeFisher, Susan W, PA-C 09/27/18 1259    Minna AntisPaduchowski, Kevin, MD 09/27/18 2018

## 2018-09-27 NOTE — ED Notes (Signed)
See triage note  Presents stating that he was exposed to [place] by his sexual partner  Denies any sx's at present

## 2018-09-27 NOTE — ED Triage Notes (Signed)
States was exposed to STD.  Here for a check.

## 2020-04-10 ENCOUNTER — Emergency Department
Admission: EM | Admit: 2020-04-10 | Discharge: 2020-04-10 | Disposition: A | Discharge status: Home / Self Care | Payer: 59 | Attending: Emergency Medicine | Admitting: Emergency Medicine

## 2020-04-10 ENCOUNTER — Emergency Department: Payer: 59

## 2020-04-10 ENCOUNTER — Other Ambulatory Visit: Payer: Self-pay

## 2020-04-10 ENCOUNTER — Encounter: Payer: Self-pay | Admitting: Emergency Medicine

## 2020-04-10 DIAGNOSIS — W010XXA Fall on same level from slipping, tripping and stumbling without subsequent striking against object, initial encounter: Secondary | ICD-10-CM | POA: Diagnosis not present

## 2020-04-10 DIAGNOSIS — F17200 Nicotine dependence, unspecified, uncomplicated: Secondary | ICD-10-CM | POA: Insufficient documentation

## 2020-04-10 DIAGNOSIS — Y990 Civilian activity done for income or pay: Secondary | ICD-10-CM | POA: Insufficient documentation

## 2020-04-10 DIAGNOSIS — Y9289 Other specified places as the place of occurrence of the external cause: Secondary | ICD-10-CM | POA: Insufficient documentation

## 2020-04-10 DIAGNOSIS — M25561 Pain in right knee: Secondary | ICD-10-CM | POA: Insufficient documentation

## 2020-04-10 NOTE — ED Provider Notes (Signed)
[facility] Emergency Department Provider Note   ____________________________________________   Event Date/Time   First MD Initiated Contact with Patient 04/10/20 1059     (approximate)  I have reviewed the triage vital signs and the nursing notes.   HISTORY  Chief Complaint Knee Pain    HPI Joseph Lutz is a 35 y.o. male patient presents with right knee pain secondary to a slip and fall which occurred at work yesterday.  Patient state had knee surgery 7 months ago with internal fixation secondary to a work-related injury..  Surgery was done in [place].  Patient continued physical therapy.       Past Medical History:  Diagnosis Date  . Obesity     There are no problems to display for this patient.   History reviewed. No pertinent surgical history.  Prior to Admission medications   Not on File    Allergies Patient has no known allergies.  No family history on file.  Social History Social History   Tobacco Use  . Smoking status: Current Every Day Smoker  . Smokeless tobacco: Never Used  Substance Use Topics  . Alcohol use: Yes  . Drug use: No    Review of Systems Constitutional: No fever/chills Eyes: No visual changes. ENT: No sore throat. Cardiovascular: Denies chest pain. Respiratory: Denies shortness of breath. Gastrointestinal: No abdominal pain.  No nausea, no vomiting.  No diarrhea.  No constipation. Genitourinary: Negative for dysuria. Musculoskeletal: Right knee pain. Skin: Negative for rash. Neurological: Negative for headaches, focal weakness or numbness.  ____________________________________________   PHYSICAL EXAM:  VITAL SIGNS: ED Triage Vitals  Enc Vitals Group     BP 04/10/20 1051 (!) 142/90     Pulse Rate 04/10/20 1051 83     Resp 04/10/20 1051 20     Temp 04/10/20 1051 98.4 F (36.9 C)     Temp Source 04/10/20 1051 Oral     SpO2 04/10/20 1051 94 %     Weight 04/10/20 1051 297 lb (134.7 kg)      Height 04/10/20 1051 5\' 11"  (1.803 m)     Head Circumference --      Peak Flow --      Pain Score 04/10/20 1055 7     Pain Loc --      Pain Edu? --      Excl. in [place]? --    Constitutional: Alert and oriented. Well appearing and in no acute distress. Cardiovascular: Normal rate, regular rhythm. Grossly normal heart sounds.  Good peripheral circulation. Respiratory: Normal respiratory effort.  No retractions. Lungs CTAB. Gastrointestinal: Soft and nontender. No distention. No abdominal bruits. No CVA tenderness. **}Musculoskeletal: No lower extremity tenderness nor edema.  No joint effusions. Neurologic:  Normal speech and language. No gross focal neurologic deficits are appreciated. No gait instability. Skin:  Skin is warm, dry and intact. No rash noted. Psychiatric: Mood and affect are normal. Speech and behavior are normal.  ____________________________________________   LABS (all labs ordered are listed, but only abnormal results are displayed)  Labs Reviewed - No data to display ____________________________________________  EKG    ____________________________________________  RADIOLOGY I, 04/12/20, personally viewed and evaluated these images (plain radiographs) as part of my medical decision making, as well as reviewing the written report by the radiologist.  ED MD interpretation: No acute findings on x-ray of the right knee. Official radiology report(s): DG Knee Complete 4 Views Right  Result Date: 04/10/2020 CLINICAL DATA:  Right knee pain  after fall. History of tibial ORIF approximately 6 months ago EXAM: RIGHT KNEE - COMPLETE 4+ VIEW COMPARISON:  None. FINDINGS: Extensive postsurgical changes from tibial ORIF with proximal lateral sideplate and screw fixation constructs. Additional long intramedullary rod and interlocking screw fixation. No perihardware loosening or fracture. Mild articular surface irregularity of the lateral tibial plateau has appearance  suggestive of healed fracture. No evidence of an acute fracture. No malalignment. Mild tibiofemoral compartment joint space narrowing. A small joint effusion is evident. Soft tissues within normal limits. IMPRESSION: 1. Extensive postsurgical changes from tibial ORIF without evidence of hardware complication. No evidence of an acute osseous abnormality. 2. Small joint effusion. Electronically Signed   By: Duanne Guess D.O.   On: 04/10/2020 11:33    ____________________________________________   PROCEDURES  Procedure(s) performed (including Critical Care):  Procedures   ____________________________________________   INITIAL IMPRESSION / ASSESSMENT AND PLAN / ED COURSE  As part of my medical decision making, I reviewed the following data within the electronic MEDICAL RECORD NUMBER         Patient presented with right knee pain secondary to a fall yesterday. Patient had prior knee surgery several months ago required internal fixation. Discussed no acute findings x-ray. Patient given discharge care instruction follow-up with on-call orthopedics to establish care since he has relocated to this area.      ____________________________________________   FINAL CLINICAL IMPRESSION(S) / ED DIAGNOSES  Final diagnoses:  Acute pain of right knee     ED Discharge Orders    None      *Please note:  Joseph Lutz was evaluated in Emergency Department on 04/10/2020 for the symptoms described in the history of present illness. He was evaluated in the context of the global COVID-19 pandemic, which necessitated consideration that the patient might be at risk for infection with the SARS-CoV-2 virus that causes COVID-19. Institutional protocols and algorithms that pertain to the evaluation of patients at risk for COVID-19 are in a state of rapid change based on information released by regulatory bodies including the CDC and federal and state organizations. These policies and algorithms were  followed during the patient's care in the ED.  Some ED evaluations and interventions may be delayed as a result of limited staffing during and the pandemic.*   Note:  This document was prepared using Dragon voice recognition software and may include unintentional dictation errors.    Joni Reining, PA-C 04/10/20 1233    Minna Antis, MD 04/10/20 [id number]

## 2020-04-10 NOTE — ED Triage Notes (Signed)
Pt states right knee pain after slipping and falling at work yesterday. 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 Had surgery on the same knee in June of '21. Wanting to be sure he didn't damage his knee. NAD.

## 2020-04-10 NOTE — ED Notes (Signed)
See triage note  States he slipped and fell yesterday  having pain to right knee  States he has had surgery on same lower leg/ankle in June 2021

## 2020-04-10 NOTE — Discharge Instructions (Addendum)
No acute findings on x-ray of the right knee.  Advised to follow-up with treating orthopedics and physical therapist.

## 2020-06-25 ENCOUNTER — Encounter: Payer: Self-pay | Admitting: Emergency Medicine

## 2020-06-25 ENCOUNTER — Emergency Department: Payer: 59

## 2020-06-25 ENCOUNTER — Other Ambulatory Visit: Payer: Self-pay

## 2020-06-25 ENCOUNTER — Emergency Department
Admission: EM | Admit: 2020-06-25 | Discharge: 2020-06-25 | Disposition: A | Discharge status: Home / Self Care | Payer: 59 | Attending: Emergency Medicine | Admitting: Emergency Medicine

## 2020-06-25 DIAGNOSIS — W010XXA Fall on same level from slipping, tripping and stumbling without subsequent striking against object, initial encounter: Secondary | ICD-10-CM | POA: Diagnosis not present

## 2020-06-25 DIAGNOSIS — M25461 Effusion, right knee: Secondary | ICD-10-CM | POA: Diagnosis not present

## 2020-06-25 DIAGNOSIS — M25561 Pain in right knee: Secondary | ICD-10-CM | POA: Diagnosis present

## 2020-06-25 DIAGNOSIS — F17200 Nicotine dependence, unspecified, uncomplicated: Secondary | ICD-10-CM | POA: Insufficient documentation

## 2020-06-25 MED ORDER — OXYCODONE-ACETAMINOPHEN 7.5-325 MG PO TABS
1.0000 | ORAL_TABLET | Freq: Once | ORAL | Status: AC
  Administered 2020-06-25: 1 via ORAL
  Filled 2020-06-25: qty 1

## 2020-06-25 MED ORDER — OXYCODONE-ACETAMINOPHEN 7.5-325 MG PO TABS: 1.0000 | ORAL_TABLET | Freq: Four times a day (QID) | ORAL | 0 refills | Status: AC | PRN

## 2020-06-25 NOTE — Discharge Instructions (Signed)
Follow-up with your orthopedic surgeon if any continued problems or concerns.  Wear the knee immobilizer for added support and protection.  A prescription for oxycodone-acetaminophen was sent to your pharmacy.  This is 1 every 6 hours as needed for moderate to severe pain.  Do not drive or operate machinery while taking this medication.  Ice and elevate your knee.  No acute fractures were noted on your x-ray today.

## 2020-06-25 NOTE — ED Provider Notes (Signed)
[facility] Emergency Department Provider Note  ____________________________________________   Event Date/Time   First MD Initiated Contact with Patient 06/25/20 1427     (approximate)  I have reviewed the triage vital signs and the nursing notes.   HISTORY  Chief Complaint Fall   HPI Joseph Lutz is a 35 y.o. male presents to the ED with complaint of right knee pain.  Patient states that he slipped and fell backwards hyperextending his right knee.  Patient has had surgery on his right knee last summer.  Patient denies any head injury or loss of consciousness.  Patient is unable to put full weight on his knee secondary to pain.  He rates his pain as an 8 out of 10.       Past Medical History:  Diagnosis Date  . Obesity     There are no problems to display for this patient.   History reviewed. No pertinent surgical history.  Prior to Admission medications   Medication Sig Start Date End Date Taking? Authorizing Provider  oxyCODONE-acetaminophen (PERCOCET) 7.5-325 MG tablet Take 1 tablet by mouth every 6 (six) hours as needed for severe pain. 06/25/20 06/25/21 Yes Tommi Rumps, PA-C    Allergies Patient has no known allergies.  History reviewed. No pertinent family history.  Social History Social History   Tobacco Use  . Smoking status: Current Every Day Smoker  . Smokeless tobacco: Never Used  Substance Use Topics  . Alcohol use: Yes  . Drug use: No    Review of Systems Constitutional: No fever/chills Eyes: No visual changes. Cardiovascular: Denies chest pain. Respiratory: Denies shortness of breath. Gastrointestinal: No abdominal pain.  No nausea, no vomiting.  Musculoskeletal: Positive for right lower extremity pain. Skin: Negative for rash. Neurological: Negative for headaches, focal weakness or numbness. ____________________________________________   PHYSICAL EXAM:  VITAL SIGNS: ED Triage Vitals [06/25/20 1257]  Enc  Vitals Group     BP (!) 123/92     Pulse Rate 100     Resp 18     Temp 98.3 F (36.8 C)     Temp Source Oral     SpO2 98 %     Weight 275 lb (124.7 kg)     Height 5\' 11"  (1.803 m)     Head Circumference      Peak Flow      Pain Score 8     Pain Loc      Pain Edu?      Excl. in GC?     Constitutional: Alert and oriented. Well appearing and in no acute distress. Eyes: Conjunctivae are normal.  Head: Atraumatic. Neck: No stridor.   Cardiovascular: Normal rate, regular rhythm. Grossly normal heart sounds.  Good peripheral circulation. Respiratory: Normal respiratory effort.  No retractions. Lungs CTAB. Gastrointestinal: Soft and nontender. No distention.  Musculoskeletal: On examination of the right knee there is no gross deformity however there is well-healed surgical scars present.  No gross deformity.  There is some effusion noted on palpation of the patella.  Range of motion is restricted secondary to patient's pain.  Skin is intact.  Pulses are present. Neurologic:  Normal speech and language. No gross focal neurologic deficits are appreciated. Skin:  Skin is warm, dry and intact.  No abrasions or discoloration noted. Psychiatric: Mood and affect are normal. Speech and behavior are normal.  ____________________________________________   LABS (all labs ordered are listed, but only abnormal results are displayed)  Labs Reviewed - No data  to display ____________________________________________  RADIOLOGY Beaulah Corin, personally viewed and evaluated these images (plain radiographs) as part of my medical decision making, as well as reviewing the written report by the radiologist.   Official radiology report(s): DG Tibia/Fibula Right  Result Date: 06/25/2020 CLINICAL DATA:  Fall, prior ORIF tibia EXAM: RIGHT TIBIA AND FIBULA - 2 VIEW COMPARISON:  04/10/2018 FINDINGS: Internal fixation lateral tibial plateau fracture again noted. No new fracture identified. Small joint  effusion superior to the patella. Intramedullary nail extends into the distal tibia with 2 interlocking screws at the level of the distal metaphysis. No acute fracture dislocation. IMPRESSION: 1. No clear acute trauma to the RIGHT tibia. 2. Prior ORIF RIGHT tibia appears intact. 3. Small suprapatellar joint effusion Electronically Signed   By: Genevive Bi M.D.   On: 06/25/2020 13:46    ____________________________________________   PROCEDURES  Procedure(s) performed (including Critical Care):  Procedures   ____________________________________________   INITIAL IMPRESSION / ASSESSMENT AND PLAN / ED COURSE  As part of my medical decision making, I reviewed the following data within the electronic MEDICAL RECORD NUMBER Notes from prior ED visits and [place] Controlled Substance Database  35 year old male presents to the ED with complaint of right knee pain after he slipped and fell backwards hyperextending his knee.  Patient denies any head injury or loss of consciousness.  Patient has had surgery on his right knee last summer and states that it has been painful since he fell.  X-rays were negative for any acute fracture.  Patient was given oxycodone-acetaminophen 7.5 mg while in the ED.  A knee immobilizer was applied and patient is encouraged to ice and elevate his leg as needed for discomfort and swelling.  He is going to follow-up with his orthopedic surgeon from last year if any continued problems with his knee.  ____________________________________________   FINAL CLINICAL IMPRESSION(S) / ED DIAGNOSES  Final diagnoses:  Acute pain of right knee     ED Discharge Orders         Ordered    oxyCODONE-acetaminophen (PERCOCET) 7.5-325 MG tablet  Every 6 hours PRN        06/25/20 1538          *Please note:  Joseph Lutz was evaluated in Emergency Department on 06/25/2020 for the symptoms described in the history of present illness. He was evaluated in the context of the global  COVID-19 pandemic, which necessitated consideration that the patient might be at risk for infection with the SARS-CoV-2 virus that causes COVID-19. Institutional protocols and algorithms that pertain to the evaluation of patients at risk for COVID-19 are in a state of rapid change based on information released by regulatory bodies including the CDC and federal and state organizations. These policies and algorithms were followed during the patient's care in the ED.  Some ED evaluations and interventions may be delayed as a result of limited staffing during and the pandemic.*   Note:  This document was prepared using Dragon voice recognition software and may include unintentional dictation errors.    Tommi Rumps, PA-C 06/25/20 1555    Sharman Cheek, MD 06/25/20 [phone]

## 2020-06-25 NOTE — ED Triage Notes (Signed)
Pt states he slipped and fell backwards hyperextending his right knee. Pt denies hitting head. Pt states had surgery on the right knee last summer and had a rod placed from lower thigh to ankle. Pt states he has not been able to walk on it today.

## 2021-09-17 ENCOUNTER — Emergency Department: Payer: Self-pay

## 2021-09-17 DIAGNOSIS — Z23 Encounter for immunization: Secondary | ICD-10-CM | POA: Insufficient documentation

## 2021-09-17 DIAGNOSIS — S61310A Laceration without foreign body of right index finger with damage to nail, initial encounter: Secondary | ICD-10-CM | POA: Insufficient documentation

## 2021-09-17 DIAGNOSIS — S61311A Laceration without foreign body of left index finger with damage to nail, initial encounter: Secondary | ICD-10-CM

## 2021-09-17 DIAGNOSIS — Y990 Civilian activity done for income or pay: Secondary | ICD-10-CM | POA: Insufficient documentation

## 2021-09-17 DIAGNOSIS — S61012A Laceration without foreign body of left thumb without damage to nail, initial encounter: Secondary | ICD-10-CM | POA: Insufficient documentation

## 2021-09-17 DIAGNOSIS — Y9289 Other specified places as the place of occurrence of the external cause: Secondary | ICD-10-CM | POA: Insufficient documentation

## 2021-09-17 DIAGNOSIS — Y9389 Activity, other specified: Secondary | ICD-10-CM | POA: Insufficient documentation

## 2021-09-17 DIAGNOSIS — S61210A Laceration without foreign body of right index finger without damage to nail, initial encounter: Secondary | ICD-10-CM

## 2021-09-17 DIAGNOSIS — W260XXA Contact with knife, initial encounter: Secondary | ICD-10-CM | POA: Insufficient documentation

## 2021-09-17 MED ORDER — ACETAMINOPHEN 500 MG PO TABS *I*
1000.0000 mg | ORAL_TABLET | Freq: Once | ORAL | Status: AC
Start: 2021-09-17 — End: 2021-09-18
  Administered 2021-09-18: 1000 mg via ORAL
  Filled 2021-09-17: qty 2

## 2021-09-17 MED ORDER — TETANUS-DIPHTH-ACELL PERT 5-2.5-18.5 LF-MCG/0.5 IM SUSP *WRAPPED*
0.5000 mL | Freq: Once | INTRAMUSCULAR | Status: AC
Start: 2021-09-17 — End: 2021-09-18
  Administered 2021-09-18: 0.5 mL via INTRAMUSCULAR
  Filled 2021-09-17: qty 0.5

## 2021-09-17 NOTE — ED Triage Notes (Signed)
Coming in today with separate finger laceration injuries obtained at work. Left thumb wound created by a knife and the Right index with a meat slicer. Bleeding controlled. Tdap unknown.

## 2021-09-18 ENCOUNTER — Emergency Department
Admission: EM | Admit: 2021-09-18 | Discharge: 2021-09-18 | Disposition: A | Discharge status: Home / Self Care | Payer: Self-pay | Source: Ambulatory Visit | Attending: Emergency Medicine | Admitting: Emergency Medicine

## 2021-09-18 DIAGNOSIS — S61310A Laceration without foreign body of right index finger with damage to nail, initial encounter: Secondary | ICD-10-CM

## 2021-09-18 DIAGNOSIS — S61012A Laceration without foreign body of left thumb without damage to nail, initial encounter: Secondary | ICD-10-CM

## 2021-09-18 NOTE — ED Provider Notes (Addendum)
History     Chief Complaint   Patient presents with   ? Finger Laceration       History provided by:  Patient  Language interpreter used: No    36 year old otherwise healthy male presenting with two finger lacerations. Patient reports yesterday (8/4) morning at work around 8am he sliced his left thumb with a knife. Bleeding was controlled with pressure. He then sliced his right pointer finger later during his shift. Unknown tdap status.       Medical/Surgical/Family History     History reviewed. No pertinent past medical history.     Patient Active Problem List   Diagnosis Code   ? Cellulitis L03.90   ? Tobacco abuse Z72.0            History reviewed. No pertinent surgical history.  Family History   Problem Relation Age of Onset   ? Heart Disease Maternal Aunt    ? Heart Disease Maternal Grandmother           Social History     Tobacco Use   ? Smoking status: Every Day     Packs/day: 1.00     Years: 13.00     Pack years: 13.00     Types: Cigarettes   ? Smokeless tobacco: Never   Substance Use Topics   ? Alcohol use: Yes     Comment: Occasionally, on the weekends   ? Drug use: Yes     Types: Marijuana     Living Situation     Questions Responses    Patient lives with Spouse    Homeless No    Caregiver for other family member     External Services     Employment Unemployed    Domestic Violence Risk                 Review of Systems   Review of Systems   All other systems reviewed and are negative.      Physical Exam     Triage Vitals  Triage Start: Start, (09/17/21 2105)  First Recorded BP: 142/87, Resp: 18, Temp: 37 ?C (98.6 ?F), Temp src: TEMPORAL Oxygen Therapy SpO2: 99 %, Oximetry Source: Rt Hand, O2 Device: None (Room air), Heart Rate: 88, (09/17/21 2102)  .  First Pain Reported  0-10 Scale: 0, Pain Location/Orientation: Other (Comment) (lft thumb and rgt index finger), (09/17/21 2102)       Physical Exam  Vitals and nursing note reviewed.   Constitutional:       General: He is not in acute distress.      Appearance: Normal appearance. He is not ill-appearing.   HENT:      Head: Normocephalic and atraumatic.      Mouth/Throat:      Mouth: Mucous membranes are moist.   Eyes:      Extraocular Movements: Extraocular movements intact.      Pupils: Pupils are equal, round, and reactive to light.   Musculoskeletal:         General: Normal range of motion.        Hands:       Comments: Neurovascularly intact   Skin:     General: Skin is warm.      Capillary Refill: Capillary refill takes less than 2 seconds.   Neurological:      General: No focal deficit present.      Mental Status: He is alert and oriented to person, place, and time.  Medical Decision Making   Patient seen by me on:  09/18/2021    Assessment:  36 year old otherwise healthy male presenting with left thumb and right pointer finger lacerations, bleeding well controlled with pressure. Unknown tdap status.    Differential diagnosis:  Laceration x 2, retained foreign body    Plan:  Orders Placed This Encounter      Finger thumb LEFT standard AP, Lateral, and Oblique views      * Finger index RIGHT standard PA, Lateral, and Oblique views      acetaminophen (TYLENOL) tablet 1,000 mg      Tdap (BOOSTRIX): tetanus, diphtheria, and accellar pertussis injection 0.5 mL    ED Course and Disposition:  X-rays of both fingers with no acute fracture or dislocation and no radiopaque foreign bodies. Lacerations were irrigated with normal saline and repaired with dermabond. Tetanus vaccination given. Patient is stable for discharge home. Return precautions given.               Verta Ellen, MD      Resident Attestation:    Patient seen by me on 09/17/2021.    I saw and evaluated the patient. I agree with the resident's/fellow's findings and plan of care as documented above. Details of my evaluation are as follows:    Additional attestation comments:  Finger lacerations secondary to a work injury, these were repaired with Dermabond.  The patient was counseled on wound  care, signs of concern, and reasons for return to the emergency department..      Author:  Marylou Mccoy, MD          Verta Ellen, MD  Resident  09/18/21 4540       Marylou Mccoy, MD  09/19/21 [phone]

## 2021-09-18 NOTE — Discharge Instructions (Addendum)
You were seen today in the emergency department for finger lacerations. Your x-rays were normal with no evidence of fracture or foreign bodies. Your lacerations were repaired with dermabond.  Please keep your wound clean and dry    Follow up with your primary doctor in 2-3 days for wound check.    You may use Ibuprofen or tylenol as directed on the bottle for pain.     If the wound becomes very red, warm, severely painful or has purulent discharge, seek medical care sooner.

## 2021-09-21 NOTE — ED Procedure Documentation (Addendum)
Procedures   Laceration repair    Date/Time: 09/17/2021 9:00 PM    Performed by: Verta Ellen, MD  Authorized by: Marylou Mccoy, MD    Consent:     Consent obtained:  Verbal    Consent given by:  Patient    Risks, benefits, and alternatives were discussed: yes      Risks discussed:  Infection, pain, retained foreign body and poor cosmetic result    Alternatives discussed:  No treatment  Universal protocol:     Procedure explained and questions answered to patient or proxy's satisfaction: yes      Imaging studies available: yes      Patient identity confirmed:  Verbally with patient  Anesthesia:     Anesthesia method:  None  Laceration details:     Location: left thumb.    Length (cm):  2.5  Pre-procedure details:     Preparation:  Patient was prepped and draped in usual sterile fashion  Exploration:     Limited defect created (wound extended): no      Hemostasis achieved with:  Direct pressure    Imaging outcome: foreign body not noted      Wound exploration: wound explored through full range of motion      Contaminated: no    Treatment:     Area cleansed with:  Saline    Amount of cleaning:  Standard    Irrigation solution:  Sterile saline    Irrigation volume:     Irrigation method:  Pressure wash    Visualized foreign bodies/material removed: no      Debridement:  None    Undermining:  None    Scar revision: no    Skin repair:     Repair method:  Tissue adhesive (dermabond)  Approximation:     Approximation:  Close  Repair type:     Repair type:  Simple  Post-procedure details:     Dressing:  Open (no dressing)    Procedure completion:  Tolerated well, no immediate complications  Comments:      Dermabond two separate lacerations: left thumb laceration measuring 2.5cm and right pointer finger measuring 0.5cm        Verta Ellen, MD     Verta Ellen, MD  Resident  09/21/21 1751       Verta Ellen, MD  Resident  09/21/21 1806    I was present and participated during the critical and key  portions and was immediately available during the remainder of the procedure.    Marylou Mccoy, MD       Marylou Mccoy, MD  09/22/21 1626

## 2021-10-22 ENCOUNTER — Emergency Department: Payer: MEDICAID

## 2021-10-22 ENCOUNTER — Emergency Department
Admission: EM | Admit: 2021-10-22 | Discharge: 2021-10-22 | Disposition: A | Discharge status: Home / Self Care | Payer: MEDICAID | Source: Ambulatory Visit | Attending: Emergency Medicine | Admitting: Emergency Medicine

## 2021-10-22 ENCOUNTER — Other Ambulatory Visit: Payer: Self-pay

## 2021-10-22 ENCOUNTER — Encounter: Payer: Self-pay | Admitting: Emergency Medicine

## 2021-10-22 DIAGNOSIS — F17210 Nicotine dependence, cigarettes, uncomplicated: Secondary | ICD-10-CM | POA: Insufficient documentation

## 2021-10-22 DIAGNOSIS — R5383 Other fatigue: Secondary | ICD-10-CM

## 2021-10-22 DIAGNOSIS — M79661 Pain in right lower leg: Secondary | ICD-10-CM | POA: Insufficient documentation

## 2021-10-22 DIAGNOSIS — L949 Localized connective tissue disorder, unspecified: Secondary | ICD-10-CM

## 2021-10-22 DIAGNOSIS — R600 Localized edema: Secondary | ICD-10-CM

## 2021-10-22 DIAGNOSIS — L02415 Cutaneous abscess of right lower limb: Secondary | ICD-10-CM | POA: Insufficient documentation

## 2021-10-22 DIAGNOSIS — L0291 Cutaneous abscess, unspecified: Secondary | ICD-10-CM

## 2021-10-22 LAB — CBC AND DIFFERENTIAL
Baso # K/uL: 0 10*3/uL (ref 0.0–0.2)
Basophil %: 0.2 %
Eos # K/uL: 0.1 10*3/uL (ref 0.0–0.5)
Eosinophil %: 1.4 %
Hematocrit: 44 % (ref 37–52)
Hemoglobin: 15 g/dL (ref 12.0–17.0)
IMM Granulocytes #: 0 10*3/uL (ref 0.0–0.0)
IMM Granulocytes: 0.2 %
Lymph # K/uL: 2.4 10*3/uL (ref 1.0–5.0)
Lymphocyte %: 24.2 %
MCH: 31 pg (ref 26–32)
MCHC: 34 g/dL (ref 32–37)
MCV: 90 fL (ref 75–100)
Mono # K/uL: 0.7 10*3/uL (ref 0.1–1.0)
Monocyte %: 6.8 %
Neut # K/uL: 6.7 10*3/uL — ABNORMAL HIGH (ref 1.5–6.5)
Nucl RBC # K/uL: 0 10*3/uL (ref 0.0–0.0)
Nucl RBC %: 0 /100 WBC (ref 0.0–0.2)
Platelets: 314 10*3/uL (ref 150–450)
RBC: 4.9 MIL/uL (ref 4.0–6.0)
RDW: 14.3 % (ref 0.0–15.0)
Seg Neut %: 67.2 %
WBC: 9.9 10*3/uL (ref 3.5–11.0)

## 2021-10-22 LAB — BASIC METABOLIC PANEL
Anion Gap: 16 (ref 7–16)
CO2: 23 mmol/L (ref 20–28)
Calcium: 9.7 mg/dL (ref 9.0–10.3)
Chloride: 102 mmol/L (ref 96–108)
Creatinine: 0.73 mg/dL (ref 0.67–1.17)
Glucose: 83 mg/dL (ref 60–99)
Lab: 9 mg/dL (ref 6–20)
Potassium: 4.3 mmol/L (ref 3.3–5.1)
Sodium: 141 mmol/L (ref 133–145)
eGFR BY CREAT: 122 *

## 2021-10-22 LAB — HOLD GREEN NO GEL

## 2021-10-22 LAB — BLOOD BANK HOLD PINK

## 2021-10-22 MED ORDER — DEXTROSE 5 % FLUSH FOR PUMPS *I*
0.0000 mL/h | INTRAVENOUS | Status: DC | PRN
Start: 2021-10-22 — End: 2021-10-23

## 2021-10-22 MED ORDER — ACETAMINOPHEN 500 MG PO TABS *I*
1000.0000 mg | ORAL_TABLET | Freq: Once | ORAL | Status: AC
Start: 2021-10-22 — End: 2021-10-22
  Administered 2021-10-22: 1000 mg via ORAL
  Filled 2021-10-22: qty 2

## 2021-10-22 MED ORDER — IBUPROFEN 600 MG PO TABS *I*
600.0000 mg | ORAL_TABLET | Freq: Four times a day (QID) | ORAL | 0 refills | Status: DC | PRN
Start: 2021-10-22 — End: 2021-12-28
  Filled 2021-10-22: qty 30, 8d supply, fill #0

## 2021-10-22 MED ORDER — CEPHALEXIN 500 MG PO CAPS *I*
500.0000 mg | ORAL_CAPSULE | Freq: Four times a day (QID) | ORAL | 0 refills | Status: AC
  Filled 2021-10-22: qty 28, 7d supply, fill #0

## 2021-10-22 MED ORDER — SODIUM CHLORIDE 0.9 % FLUSH FOR PUMPS *I*
0.0000 mL/h | INTRAVENOUS | Status: DC | PRN
Start: 2021-10-22 — End: 2021-10-23

## 2021-10-22 MED ORDER — OXYCODONE HCL 5 MG PO TABS *I*
5.0000 mg | ORAL_TABLET | ORAL | 0 refills | Status: DC | PRN
Start: 2021-10-22 — End: 2021-12-28
  Filled 2021-10-22: qty 6, 1d supply, fill #0

## 2021-10-22 MED ORDER — OXYCODONE HCL 5 MG PO TABS *I*
5.0000 mg | ORAL_TABLET | Freq: Once | ORAL | Status: AC
Start: 2021-10-22 — End: 2021-10-22
  Administered 2021-10-22: 5 mg via ORAL
  Filled 2021-10-22: qty 1

## 2021-10-22 MED ORDER — IBUPROFEN 600 MG PO TABS *I*
600.0000 mg | ORAL_TABLET | Freq: Once | ORAL | Status: AC
Start: 2021-10-22 — End: 2021-10-22
  Administered 2021-10-22: 600 mg via ORAL
  Filled 2021-10-22: qty 1

## 2021-10-22 NOTE — ED Provider Notes (Signed)
[facility]  History   36 year old nondiabetic man presents with abscess and drainage of his right leg.  Per the patient, he had surgery with hardware in 2021 to this affected site.  He states for the last 2 to 3 days he has noticed redness and warmth.  There was no recent antecedent trauma.  He reports no insect stings or bites to the area.  He states that the wound started draining spontaneously this morning.      History provided by:  Patient      There is no problem list on file for this patient.      History reviewed. No pertinent past medical history.    Past Surgical History:   Procedure Laterality Date   . ORIF TIBIA & FIBULA FRACTURES  2021       No family history on file.    Social History     Tobacco Use   . Smoking status: Every Day     Packs/day: 1.00     Years: 15.00     Pack years: 15.00     Types: Cigarettes   Substance Use Topics   . Alcohol use: Not Currently   . Drug use: Not Currently     Sexual Activity     Substance and Sexual Activity   Sexual Activity Not on file       Review of Systems   Constitutional: Negative for chills and fever.   Musculoskeletal: Positive for arthralgias.   Skin: Positive for color change and wound.       Physical Exam     Vitals:    10/22/21 1107   BP: 119/83   Pulse: 78   Resp: 16   Temp: 36.6 C (97.9 F)   TempSrc: Temporal   SpO2: 96%       Physical Exam  Vitals and nursing note reviewed.   Constitutional:       Appearance: Normal appearance. He is not toxic-appearing.   Cardiovascular:      Rate and Rhythm: Normal rate.   Pulmonary:      Effort: Pulmonary effort is normal.   Musculoskeletal:         General: Swelling and tenderness present.      Comments: Patient has 1+ edema from his upper shin to his foot.   Skin:     Capillary Refill: Capillary refill takes less than 2 seconds.      Findings: Erythema and lesion present.      Comments: There is a 3 cm central abscess exiting the patient's previous surgical scar.  There is free-flowing purulent material from  the wound.  The area is exquisitely tender.  There is a surrounding 8 x 10 cm area of erythema, warmth and induration.   Neurological:      Mental Status: He is alert and oriented to person, place, and time.   Psychiatric:         Mood and Affect: Mood normal.         Behavior: Behavior normal.         Thought Content: Thought content normal.         ED Procedures   Procedures    ED Course   ED MDM:      Differential Diagnosis:     Differential diagnosis includes:  Abscess, wound infection, hardware infection.    ED Course:     ED course details:  Patient was seen and evaluated.  We discussed imaging at the urgent  care however, his case would need to be seen by orthopedics given the underlying hardware.  Patient opted to go to [facility] directly for full treatment of his wound.  The wound was dressed with nonstick dressing and gauze roll.    Discussion with independent historian(s):     Independent historian(s) utilized:  Patient    Disposition:     Discharge: I had a discussion with the patient and/or guardian regarding discharge diagnosis and plan.  Based on the patient's history, exam and diagnostic evaluation, there is no indication for further emergent intervention or inpatient treatment.  Verbal and written discharge instructions and warnings were provided. Patient was encouraged to return for any worsening symptoms, persisting symptoms, or any other concerns. Patient was provided the opportunity to ask questions.        Follow up: Patient was advised to follow-up as instructed.                               ED [employer], Amy, DO  10/22/21 1151

## 2021-10-22 NOTE — ED Notes (Signed)
Writer reviewed discharge instructions with patient, patient verbalized understanding of instructions. IV removed. A&Ox4 and ambulatory at discharge. Patient discharged home.

## 2021-10-22 NOTE — ED Triage Notes (Signed)
Patient with fx in R foot with hardware. C/o abscess, redness, swelling and drainage to site x2 days. Denies fever/chills. See call in note. Ambulatory.     Prehospital medications given: No

## 2021-10-22 NOTE — ED Provider Notes (Cosign Needed)
History     Chief Complaint   Patient presents with   . Wound Check     [name] is a 36 year old nondiabetic man presenting with draining wound on his lower right leg. Per patient he had surgery with hardware placed to this area in 2021. Over the past few days the area has felt warm and looked red. This morning in the shower he noticed purulent drainage and states that the affected area seemed swollen and to be "bubbling" up from his leg. Endorses constant pain at the affected site following his accident and surgery in 2021. Denies worsening pain over the past few days. He denies any recent trauma to the area. He denies any nausea, vomiting, fever, chills. He endorses feeling drowsy. He states that the area feels hot right now. The area was wrapped at Urgent care today. Patient is a smoker.            Medical/Surgical/Family History     No past medical history on file.     Patient Active Problem List   Diagnosis Code   . Cellulitis L03.90   . Tobacco abuse Z72.0            No past surgical history on file.       Social History     Tobacco Use   . Smoking status: Every Day     Packs/day: 1.00     Years: 13.00     Pack years: 13.00     Types: Cigarettes   . Smokeless tobacco: Never   Substance Use Topics   . Alcohol use: Yes     Comment: Occasionally, on the weekends   . Drug use: Yes     Types: Marijuana             Review of Systems   Constitutional: Positive for fatigue. Negative for chills and fever.   Respiratory: Negative for chest tightness and shortness of breath.    Gastrointestinal: Negative for nausea and vomiting.   Musculoskeletal: Positive for arthralgias.   Skin: Positive for wound.       Physical Exam     Triage Vitals  Triage Start: Start, (10/22/21 1207)  First Recorded BP: 121/79, Resp: 16, Temp: 36.1 C (97 F) Oxygen Therapy SpO2: 99 %, O2 Device: None (Room air), Heart Rate: 81, (10/22/21 1209)  .  First Pain Reported  0-10 Scale: 8, (10/22/21 1209)       Physical  Exam  Constitutional:       General: He is not in acute distress.     Appearance: Normal appearance. He is not toxic-appearing.   HENT:      Head: Normocephalic and atraumatic.   Cardiovascular:      Rate and Rhythm: Normal rate and regular rhythm.      Pulses: Normal pulses.      Comments: b/l dorsalis pedis pulse 2+  Pulmonary:      Effort: Pulmonary effort is normal.      Breath sounds: Wheezing present.   Skin:     Findings: Lesion present.   Neurological:      Mental Status: He is alert.         Medical Decision Making     Assessment:  Uriel Halbrooks is a 36 year old male presenting with subcutaneous soft tissue edema and spontaneous drainage of seroma involving the leg. Osteomyelitis unlikely given reassuring XR.    Differential diagnosis:  Spontaneous drainage of seroma, r/o osteomyelitis  Plan:  Acetaminophen 1000mg , ibuprofen 600mg , oxycodone 5mg , XR R tibia, fibula, CBC w diff, CMP    ED Course and Disposition:  Oxycodone 5mg  with relief. Wound was unwrapped and slight pressure was applied to the area causing serosal fluid to drain. Labs reassuring. XR tibia/fibula showed no hardware complication. No radiographic findings to suggest osteomyelitis. Subcutaneous soft tissue edema involving the leg.     Discharged home with 6 capsules 5mg  oxycodone IR for pain control and keflex 500mg  q6 for 7 days and referral to outpatient PCP.              Glee Arvin, Jaritza Duignan  10/22/21 2055

## 2021-10-22 NOTE — Discharge Instructions (Addendum)
Clean the wound twice daily. Apply clean bandages. Take the antibiotics as prescribed. Follow-up with primary care (referral made for you). Return to the emergency department if you have worsening pain, fevers, redness or swelling spreading from the abscess, or any other concerns.

## 2021-10-22 NOTE — ED Notes (Signed)
10/22/21 1147   Expected Patient   Date of notification 10/22/21   Time Notified 1143   Notified by Office/PCP   ED Service Adult Call-in   Pt Info note/Reason for sending Hx of fx fixed with hardware. Now with abcess and cellulitus to location ( R leg ) Site also having prululent drainage.   Patient coming from Home   Expected Call-In Information   Temp 36.6 C (97.9 F)   Pulse 78   Requested Evaluation By adult ed   Report called to Call in

## 2021-10-23 ENCOUNTER — Other Ambulatory Visit: Payer: Self-pay

## 2021-10-23 NOTE — Progress Notes (Signed)
[facility] SOCIAL WORK  PHARMACY FORM     Today's date:  October 23, 2021    Patient Name: [name]      Medical Record #: [id number]   DOB: 11-Oct-1985  Patient's Address: [address] [place]              Social Worker: Aretta Nip, LMSW       Date of Service: October 23, 2021       Funding Source: [place] [employer]  ___________________________________________________________________    Pharmacy Information:  Date/time sent: October 23, 2021     Time needed: Today    Patient Location: ED    Medication Pick-up Preference: Patient will pick up at the pharmacy    Pharmacy Contact: Charlynne Pander  Social work signature: Carolan Shiver Mirabella Hilario, LMSW  Supervisor/Manager Approval (if indicated):  Date:  (supervisor signature not required for Medicaid pending)    Joetta Manners, LMSW  Social Work  Emergency Department  [phone]  [facility] [phone]

## 2021-11-26 ENCOUNTER — Ambulatory Visit: Payer: Self-pay | Admitting: Internal Medicine

## 2021-12-19 ENCOUNTER — Emergency Department: Payer: MEDICAID

## 2021-12-19 ENCOUNTER — Inpatient Hospital Stay
Admission: EM | Admit: 2021-12-19 | Discharge: 2021-12-28 | DRG: 313 | Disposition: A | Discharge status: Home Health | Payer: MEDICAID | Source: Ambulatory Visit | Attending: Orthopedic Surgery | Admitting: Orthopedic Surgery

## 2021-12-19 DIAGNOSIS — M00861 Arthritis due to other bacteria, right knee: Secondary | ICD-10-CM | POA: Diagnosis present

## 2021-12-19 DIAGNOSIS — Z789 Other specified health status: Secondary | ICD-10-CM

## 2021-12-19 DIAGNOSIS — E669 Obesity, unspecified: Secondary | ICD-10-CM | POA: Diagnosis present

## 2021-12-19 DIAGNOSIS — L02419 Cutaneous abscess of limb, unspecified: Secondary | ICD-10-CM

## 2021-12-19 DIAGNOSIS — I444 Left anterior fascicular block: Secondary | ICD-10-CM

## 2021-12-19 DIAGNOSIS — Z6839 Body mass index (BMI) 39.0-39.9, adult: Secondary | ICD-10-CM

## 2021-12-19 DIAGNOSIS — F17210 Nicotine dependence, cigarettes, uncomplicated: Secondary | ICD-10-CM | POA: Diagnosis present

## 2021-12-19 DIAGNOSIS — L988 Other specified disorders of the skin and subcutaneous tissue: Secondary | ICD-10-CM | POA: Diagnosis present

## 2021-12-19 DIAGNOSIS — S82141D Displaced bicondylar fracture of right tibia, subsequent encounter for closed fracture with routine healing: Secondary | ICD-10-CM

## 2021-12-19 DIAGNOSIS — S82141S Displaced bicondylar fracture of right tibia, sequela: Secondary | ICD-10-CM

## 2021-12-19 DIAGNOSIS — M85861 Other specified disorders of bone density and structure, right lower leg: Secondary | ICD-10-CM

## 2021-12-19 DIAGNOSIS — Z8781 Personal history of (healed) traumatic fracture: Secondary | ICD-10-CM

## 2021-12-19 DIAGNOSIS — M868X6 Other osteomyelitis, lower leg: Principal | ICD-10-CM | POA: Diagnosis present

## 2021-12-19 DIAGNOSIS — M25461 Effusion, right knee: Secondary | ICD-10-CM

## 2021-12-19 DIAGNOSIS — T847XXA Infection and inflammatory reaction due to other internal orthopedic prosthetic devices, implants and grafts, initial encounter: Secondary | ICD-10-CM

## 2021-12-19 DIAGNOSIS — R591 Generalized enlarged lymph nodes: Secondary | ICD-10-CM

## 2021-12-19 LAB — BODY FLUID CRYSTAL

## 2021-12-19 LAB — CBC AND DIFFERENTIAL
Baso # K/uL: 0 10*3/uL (ref 0.0–0.2)
Basophil %: 0.4 %
Eos # K/uL: 0.1 10*3/uL (ref 0.0–0.5)
Eosinophil %: 1.4 %
Hematocrit: 43 % (ref 37–52)
Hemoglobin: 14.3 g/dL (ref 12.0–17.0)
IMM Granulocytes #: 0.1 10*3/uL — ABNORMAL HIGH (ref 0.0–0.0)
IMM Granulocytes: 0.8 %
Lymph # K/uL: 1.9 10*3/uL (ref 1.0–5.0)
Lymphocyte %: 23.9 %
MCH: 30 pg (ref 26–32)
MCHC: 34 g/dL (ref 32–37)
MCV: 90 fL (ref 75–100)
Mono # K/uL: 0.6 10*3/uL (ref 0.1–1.0)
Monocyte %: 7.4 %
Neut # K/uL: 5.2 10*3/uL (ref 1.5–6.5)
Nucl RBC # K/uL: 0 10*3/uL (ref 0.0–0.0)
Nucl RBC %: 0 /100 WBC (ref 0.0–0.2)
Platelets: 296 10*3/uL (ref 150–450)
RBC: 4.8 MIL/uL (ref 4.0–6.0)
RDW: 14.1 % (ref 0.0–15.0)
Seg Neut %: 66.1 %
WBC: 7.9 10*3/uL (ref 3.5–11.0)

## 2021-12-19 LAB — BASIC METABOLIC PANEL
Anion Gap: 13 (ref 7–16)
CO2: 21 mmol/L (ref 20–28)
Calcium: 9.2 mg/dL (ref 9.0–10.3)
Chloride: 106 mmol/L (ref 96–108)
Creatinine: 0.69 mg/dL (ref 0.67–1.17)
Glucose: 93 mg/dL (ref 60–99)
Lab: 12 mg/dL (ref 6–20)
Potassium: 4.8 mmol/L (ref 3.3–5.1)
Sodium: 140 mmol/L (ref 133–145)
eGFR BY CREAT: 124 *

## 2021-12-19 LAB — GRAM STAIN
Gram Stain: 0
Gram Stain: 0

## 2021-12-19 LAB — TYPE AND SCREEN
ABO RH Blood Type: A POS
Antibody Screen: NEGATIVE

## 2021-12-19 LAB — CYTOSPIN GRAM STAIN
Cytospin Gram Stain: 0
Cytospin Gram Stain: 0

## 2021-12-19 LAB — PROTIME-INR
INR: 1 (ref 0.9–1.1)
Protime: 11.7 s (ref 10.0–12.9)

## 2021-12-19 LAB — HOLD GRAY

## 2021-12-19 LAB — SEDIMENTATION RATE, AUTOMATED: Sedimentation Rate: 64 mm/hr — ABNORMAL HIGH (ref 0–15)

## 2021-12-19 LAB — HOLD GREEN NO GEL

## 2021-12-19 LAB — HOLD BLUE

## 2021-12-19 LAB — CRP: CRP: 70 mg/L — ABNORMAL HIGH (ref 0–8)

## 2021-12-19 MED ORDER — IOHEXOL 350 MG/ML (OMNIPAQUE) IV SOLN 500ML BOTTLE *I*
1.0000 mL | Freq: Once | INTRAVENOUS | Status: AC
Start: 2021-12-19 — End: 2021-12-19
  Administered 2021-12-19: 149 mL via INTRAVENOUS

## 2021-12-19 MED ORDER — HYDROMORPHONE HCL PF 1 MG/ML IJ SOLN *WRAPPED*
1.0000 mg | Freq: Once | INTRAMUSCULAR | Status: AC
Start: 2021-12-19 — End: 2021-12-19
  Administered 2021-12-19: 1 mg via INTRAVENOUS
  Filled 2021-12-19: qty 1

## 2021-12-19 MED ORDER — OXYCODONE HCL 5 MG PO TABS *I*
5.0000 mg | ORAL_TABLET | Freq: Four times a day (QID) | ORAL | Status: DC | PRN
Start: 2021-12-19 — End: 2021-12-20

## 2021-12-19 MED ORDER — OXYCODONE HCL 10 MG PO TABS *I*
10.0000 mg | ORAL_TABLET | Freq: Four times a day (QID) | ORAL | Status: DC | PRN
Start: 2021-12-19 — End: 2021-12-20
  Administered 2021-12-19: 10 mg via ORAL
  Filled 2021-12-19: qty 1

## 2021-12-19 MED ORDER — ENOXAPARIN SODIUM 40 MG/0.4ML IJ SOSY *I*
40.0000 mg | PREFILLED_SYRINGE | INTRAMUSCULAR | Status: DC
Start: 2021-12-19 — End: 2021-12-25
  Administered 2021-12-19 – 2021-12-24 (×6): 40 mg via SUBCUTANEOUS
  Filled 2021-12-19 (×6): qty 0.4

## 2021-12-19 MED ORDER — POLYETHYLENE GLYCOL 3350 PO PACK 17 GM *I*
17.0000 g | PACK | Freq: Every day | ORAL | Status: DC
Start: 2021-12-19 — End: 2021-12-20
  Administered 2021-12-19: 17 g via ORAL
  Filled 2021-12-19: qty 17

## 2021-12-19 MED ORDER — SODIUM CHLORIDE 0.9 % FLUSH FOR PUMPS *I*
0.0000 mL/h | INTRAVENOUS | Status: DC | PRN
Start: 2021-12-19 — End: 2021-12-28

## 2021-12-19 MED ORDER — DEXTROSE 5 % FLUSH FOR PUMPS *I*
0.0000 mL/h | INTRAVENOUS | Status: DC | PRN
Start: 2021-12-19 — End: 2021-12-28

## 2021-12-19 NOTE — Bed Hold Note (Signed)
Bed: G16-H02  Expected date: 12/19/21  Expected time:   Means of arrival:   Comments:  [facility]

## 2021-12-19 NOTE — Invasive Procedure Plan of Care (Cosign Needed Addendum)
[facility] HOSPITAL  [facility]  [facility]    CONSENT FOR MEDICAL  OR SURGICAL PROCEDURE                            Patient Name: [name]  [facility] [address]                                                              DOB: November 18, 1985           Please read this form or have someone read it to you.   It's important to understand all parts of this form. If something isn't clear, ask [place] to explain.   When you sign it, that means you understand the form and give [place] permission to do this surgery or procedure.     I agree for Orthopaedic Trauma Surgery Attending along with any assistants* they may choose, to treat the following condition(s): RIGHT lower extremity abscess, possible infected right tibial hardware    By doing this surgery or procedure on me: RIGHT lower extremity irrigation and debridement, possible RIGHT lower extremity hardware removal, all indicated procedures    This is also known as: Washing out the infection, possibly removing the hardware   Laterality: RIGHT    1. The care provider has explained my condition to me. They have told me how the procedure can help me. They have told me about other ways of treating my condition. I understand the care provider cannot guarantee the result of the procedure. If I don't have this procedure, my other choices are: No Surgery; Non-operative management    2. The care provider has told me the risks (problems that can happen) of the procedure. I understand there may be unwanted results. The risks that are related to this procedure include:   - Bleeding  - Infection  - Damage to surrounding structures (muscles, tendons, nerves, blood vessels),   - Failure of the bone to heal,  - Failure of the wound to heal,   - Hardware failure,   - Need for further surgery  - Persistent pain  - Risks of anesthesia (including but not limited to heart attack, stroke and death)  - Deep vein thrombosis  - Pulmonary embolus      3. I understand that during the  procedure, my care provider may find a condition that we didn't know about before the treatment started. Therefore, I agree that my care provider can perform any other treatment which they think is necessary and available.    4. I understand the care provider may remove tissue, body parts, or materials during this procedure. These materials may be used to help with my diagnosis and treatment. They might also be used for teaching purposes or for research studies that I have separately agreed to participate in. Otherwise they will be disposed of as required by law.    5. My care provider might want a representative from a medical device company to be there during my procedure. I understand that person works for:  [employer], Publishing copy, [employer], Publishing rights manager        The ways they might help my care provider during my procedure include:        Helping the  operating room staff prepare the special device my care provider wants to use and Providing information and support to operating room staff regarding the device    6. Here are my decisions about receiving blood, blood products, or tissues. I understand my decisions cover the time before, during and after my procedure, my treatment, and my time in the hospital. After my procedure, if my condition changes a lot, my care provider will talk with me again about receiving blood or blood products. At that time, my care provider might need me to review and sign another consent form, about getting or refusing blood.    I understand that the blood is from the community blood supply. Volunteers donated the blood, the volunteers were screened for health problems. The blood was examined with very sensitive and accurate tests to look for hepatitis, HIV/AIDS, and other diseases. Before I receive blood, it is tested again to make sure it is the correct type.    My chances of getting a sickness from blood products are small. But no transfusion is 100% safe. I understand that  my care provider feels the good I will receive from the blood is greater than the chances of something going wrong. My care provider has answered my questions about blood products.      My decision  about blood or  blood products   Yes, I agree to receive blood or blood products if my care provider thinks they're needed.        My decision   about tissue  Implants     Yes, I agree to receive tissue implants if my care provider thinks they're needed.          I understand this  form.    My care provider  or his/her  assistants have  explained:   What I am having done and why I need it.  What other choices I can make instead of having this done.  The benefits and possible risks (problems) to me of having this done.  The benefits and possible risks (problems) to me of receiving transplants, blood, or blood products.  There is no guarantee of the results.  The care provider may not stay with me the entire time that I am in the operating or procedure room.  My provider has explained how this may affect my procedure. My provider has answered my  questions about this.         I give my  permission for  this surgery or  procedure.          _______________________________________________                                     My signature  (or parent or other person authorized to sign for you, if you are unable to sign for  yourself or if you are under 53 years old)        12/19/2021  ______           Date   6:52 PM       _____        Time   Electronic Signatures will display at the bottom of the consent form.    Care provider's statement: I have discussed the planned procedure, including the possibility for transfusion of blood  products or receipt of tissue as necessary; expected benefits; the possible complications and risks; and possible alternatives  and their benefits and risks with the patients or the patient's surrogate. In my opinion, the patient or the patient's surrogate  understands the proposed procedure, its risks,  benefits and alternatives.              Electronically signed by: Janeece Agee, MD                             12/19/2021          Date        6:52 PM          Time   Your doctor or someone your doctor has appointed has told you that you may need blood or a blood product transfusion, which has been collected from volunteers, as part of your treatment as a patient.    The reasons you might need blood or blood products include, but are not limited to:    Significant loss of your own blood   Your body may not be getting enough oxygen to its tissues    Treatment of bleeding disorders caused by low platelet counts or platelets that do not work right (platelets are part of a cell that helps to form clots and keeps you from bleeding too much).   You may not have enough of other substances that help your blood clot or stop you from bleeding more  The risks of getting a transfusion of blood or blood products include, but are not limited to:    Damage to the lungs   Difficulty breathing due to fluid in the lungs   The product may contain bacteria or rarely a virus (which includes HIV and Hepatitis)   Blood from the community blood supply has been collected from volunteer donors who have been screened for health risk. The blood has been tested for major blood transmitted disease, but no transfusion is 100% safe. The blood is tested with very sensitive and accurate tests to screen for hepatitis, AIDS, and other disease, which makes the risks very small.   You may have side effects from the transfusion (rash, fever, chills) or an allergic reaction   The transfusion increases your risks of getting infection or cancer coming back   The transfusion can increase the time you have to stay in the hospital   The transfusion can potentially cause death if the wrong blood is given or your body rejects the blood   Before blood is transfused, it is tested again to make sure it is the correct type  There are other options than  getting blood or blood products from other people and they include:    Drugs which can decrease bleeding   Drugs which can cause your body to make more blood (used in elective procedures with advance notice)   Autologous (your own blood) donation (needs pre-arrangement)   No transfusion  If you exercise your right to refuse to be transfused with blood or blood products; these things listed below, among others, could happen to you:   Your body may not get enough oxygen and suffer damage   You may have a higher chance of bleeding   You may limit other options for your condition   You may die from losing too much blood

## 2021-12-19 NOTE — ED Triage Notes (Signed)
Patient started with a bump on right knee 2 days ago.  Bump has grown and is hard and painful to the touch.  Unable to visualize in triage due to clothing.  Pain with palpation.     Prehospital medications given: No
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
## 2021-12-19 NOTE — ED Provider Notes (Signed)
History     Chief Complaint   Patient presents with    Leg Swelling     Pt 36 y/o male with PMH of orthopedic surgery on right lower extremity presents with pain and swelling to the proximal right lower extremity. He states the pain and swelling has been present for 2 days with difficulty extending the right leg. He has been ambulatory with pain. He has been treating the pain with ibuprofen and tylenol. He states he seen for an a      History provided by:  Patient  Language interpreter used: No          Medical/Surgical/Family History     History reviewed. No pertinent past medical history.     Patient Active Problem List   Diagnosis Code    Cellulitis L03.90    Tobacco abuse Z72.0            History reviewed. No pertinent surgical history.       Social History     Tobacco Use    Smoking status: Every Day     Packs/day: 1.00     Years: 13.00     Pack years: 13.00     Types: Cigarettes    Smokeless tobacco: Never   Substance Use Topics    Alcohol use: Yes     Comment: Occasionally, on the weekends    Drug use: Yes     Types: Marijuana             Review of Systems    Physical Exam     Triage Vitals     First Recorded BP: (!) 172/101, Resp: 18, Temp: 36 C (96.8 F) Oxygen Therapy SpO2: 99 %, O2 Device: None (Room air), Heart Rate: 100, (12/19/21 0734)  .  First Pain Reported  0-10 Scale: 7, Pain Location/Orientation: Knee Right, (12/19/21 0848)       Physical Exam    Medical Decision Making            ED Course as of 12/19/21 1332   Sun Dec 19, 2021   1330 Orthopedic surgery contacted regarding consultation for abscess and septic arthritis of the right knee and admit with IV antibiotics.        Alberteen Sam, MD

## 2021-12-19 NOTE — ED Notes (Signed)
Report Given To  Handoff completed on 12/19/2021 at 8:33 PM. Please call [phone] with questions.  Pt to travel in wheelchair .    Shanda Bumps C,RN       Descriptive Sentence / Reason for Admission   Patient started with a bump on right knee 2 days ago.  Bump has grown and is hard and painful to the touch.  Unable to visualize in triage due to clothing.  Pain with palpation.       Active Issues / Relevant Events   Unk code  A/Ox4  Ambulatory   NPO  Ortho drained and sent cultures   CT: 3.2 x 2.1 cm rim-enhancing soft tissue fluid collection with adjacent soft tissue stranding/edema along the medial proximal aspect of the tibia surround the head of screw.   Xray: Osteomyelitis and small join effusion    CRP: 70  Sed: 64      To Do List  VS/A  Meds per [facility]  Ortho waiting on cultures       Anticipatory Guidance / Discharge Planning  Pending

## 2021-12-20 ENCOUNTER — Other Ambulatory Visit: Payer: Self-pay

## 2021-12-20 ENCOUNTER — Inpatient Hospital Stay: Payer: MEDICAID | Admitting: Anesthesiology

## 2021-12-20 ENCOUNTER — Encounter
Admission: EM | Disposition: A | Discharge status: Home Health | Payer: Self-pay | Source: Ambulatory Visit | Attending: Orthopedic Surgery

## 2021-12-20 ENCOUNTER — Inpatient Hospital Stay: Payer: MEDICAID

## 2021-12-20 DIAGNOSIS — B952 Enterococcus as the cause of diseases classified elsewhere: Secondary | ICD-10-CM

## 2021-12-20 DIAGNOSIS — Z472 Encounter for removal of internal fixation device: Secondary | ICD-10-CM

## 2021-12-20 DIAGNOSIS — S82201S Unspecified fracture of shaft of right tibia, sequela: Secondary | ICD-10-CM

## 2021-12-20 DIAGNOSIS — L02415 Cutaneous abscess of right lower limb: Secondary | ICD-10-CM

## 2021-12-20 DIAGNOSIS — Z1624 Resistance to multiple antibiotics: Secondary | ICD-10-CM

## 2021-12-20 DIAGNOSIS — S82121S Displaced fracture of lateral condyle of right tibia, sequela: Secondary | ICD-10-CM

## 2021-12-20 DIAGNOSIS — M86161 Other acute osteomyelitis, right tibia and fibula: Secondary | ICD-10-CM

## 2021-12-20 DIAGNOSIS — M00861 Arthritis due to other bacteria, right knee: Secondary | ICD-10-CM

## 2021-12-20 DIAGNOSIS — T797XXA Traumatic subcutaneous emphysema, initial encounter: Secondary | ICD-10-CM

## 2021-12-20 LAB — BODY FLUID CELL COUNT
Nucl Cell,FL: 4933 /uL
RBC,FL: 3822 /uL
Segs #,FL: 888 /uL
Segs %,Fl: 18 %

## 2021-12-20 LAB — BASIC METABOLIC PANEL
Anion Gap: 12 (ref 7–16)
CO2: 23 mmol/L (ref 20–28)
Calcium: 8.9 mg/dL — ABNORMAL LOW (ref 9.0–10.3)
Chloride: 103 mmol/L (ref 96–108)
Creatinine: 0.69 mg/dL (ref 0.67–1.17)
Glucose: 123 mg/dL — ABNORMAL HIGH (ref 60–99)
Lab: 10 mg/dL (ref 6–20)
Potassium: 4.5 mmol/L (ref 3.3–5.1)
Sodium: 138 mmol/L (ref 133–145)
eGFR BY CREAT: 124 *

## 2021-12-20 LAB — POCT GLUCOSE: Glucose POCT: 101 mg/dL — ABNORMAL HIGH (ref 60–99)

## 2021-12-20 LAB — CBC
Hematocrit: 41 % (ref 37–52)
Hemoglobin: 13.9 g/dL (ref 12.0–17.0)
MCH: 30 pg (ref 26–32)
MCHC: 34 g/dL (ref 32–37)
MCV: 88 fL (ref 75–100)
Platelets: 302 10*3/uL (ref 150–450)
RBC: 4.6 MIL/uL (ref 4.0–6.0)
RDW: 13.8 % (ref 0.0–15.0)
WBC: 11.3 10*3/uL — ABNORMAL HIGH (ref 3.5–11.0)

## 2021-12-20 LAB — GRAM STAIN
Gram Stain: 0
Gram Stain: 0
Gram Stain: 0
Gram Stain: 0
Gram Stain: 0
Gram Stain: 0

## 2021-12-20 LAB — CRP: CRP: 92 mg/L — ABNORMAL HIGH (ref 0–8)

## 2021-12-20 LAB — SEDIMENTATION RATE, AUTOMATED: Sedimentation Rate: 76 mm/hr — ABNORMAL HIGH (ref 0–15)

## 2021-12-20 LAB — PATH REVIEW,FL

## 2021-12-20 SURGERY — IRRIGATION AND DEBRIDEMENT, LOWER EXTREMITY
Anesthesia: General | Site: Leg | Laterality: Right | Wound class: Dirty or Infected

## 2021-12-20 MED ORDER — HYDROMORPHONE HCL PF 1 MG/ML IJ SOLN *WRAPPED*
INTRAMUSCULAR | Status: AC
Start: 2021-12-20 — End: 2021-12-20
  Filled 2021-12-20: qty 1

## 2021-12-20 MED ORDER — ONDANSETRON HCL 2 MG/ML IV SOLN *I*
1.0000 mg | Freq: Once | INTRAMUSCULAR | Status: DC | PRN
Start: 2021-12-20 — End: 2021-12-20

## 2021-12-20 MED ORDER — HYDROMORPHONE HCL PF 1 MG/ML IJ SOLN *WRAPPED*
0.2500 mg | INTRAMUSCULAR | Status: DC | PRN
Start: 2021-12-20 — End: 2021-12-20

## 2021-12-20 MED ORDER — IOHEXOL 350 MG/ML (OMNIPAQUE) IV SOLN 500ML BOTTLE *I*
1.0000 mL | Freq: Once | INTRAVENOUS | Status: AC
Start: 2021-12-20 — End: 2021-12-20
  Administered 2021-12-20: 142 mL via INTRAVENOUS

## 2021-12-20 MED ORDER — FENTANYL CITRATE 50 MCG/ML IJ SOLN *WRAPPED*
INTRAMUSCULAR | Status: DC | PRN
Start: 2021-12-20 — End: 2021-12-20
  Administered 2021-12-20: 100 ug via INTRAVENOUS
  Administered 2021-12-20 (×2): 50 ug via INTRAVENOUS

## 2021-12-20 MED ORDER — HYDROMORPHONE HCL PF 1 MG/ML IJ SOLN *WRAPPED*
0.5000 mg | INTRAMUSCULAR | Status: AC | PRN
Start: 2021-12-20 — End: 2021-12-21
  Administered 2021-12-20: 0.5 mg via INTRAVENOUS
  Filled 2021-12-20: qty 0.5

## 2021-12-20 MED ORDER — MIDAZOLAM HCL 1 MG/ML IJ SOLN *I* WRAPPED
INTRAMUSCULAR | Status: AC
Start: 2021-12-20 — End: 2021-12-20
  Filled 2021-12-20: qty 2

## 2021-12-20 MED ORDER — DOCUSATE SODIUM 100 MG PO CAPS *I*
200.0000 mg | ORAL_CAPSULE | Freq: Every evening | ORAL | Status: DC
Start: 2021-12-20 — End: 2021-12-28
  Administered 2021-12-20 – 2021-12-25 (×6): 200 mg via ORAL
  Filled 2021-12-20 (×6): qty 2

## 2021-12-20 MED ORDER — FENTANYL CITRATE 50 MCG/ML IJ SOLN *WRAPPED*
INTRAMUSCULAR | Status: AC
Start: 2021-12-20 — End: 2021-12-20
  Filled 2021-12-20: qty 2

## 2021-12-20 MED ORDER — POLYETHYLENE GLYCOL 3350 PO PACK 17 GM *I*
17.0000 g | PACK | Freq: Every day | ORAL | Status: AC
Start: 2021-12-21 — End: 2021-12-28
  Administered 2021-12-21 – 2021-12-23 (×3): 17 g via ORAL
  Filled 2021-12-20 (×6): qty 17

## 2021-12-20 MED ORDER — OXYCODONE HCL 5 MG PO TABS *I*
5.0000 mg | ORAL_TABLET | ORAL | Status: DC | PRN
Start: 2021-12-20 — End: 2021-12-22
  Administered 2021-12-21: 5 mg via ORAL
  Filled 2021-12-20: qty 1

## 2021-12-20 MED ORDER — DEXMEDETOMIDINE HCL IN NACL 80 MCG/20ML (4 MCG/ML) IV SOLN *I*
INTRAVENOUS | Status: AC
Start: 2021-12-20 — End: 2021-12-20
  Filled 2021-12-20: qty 20

## 2021-12-20 MED ORDER — OXYCODONE HCL 10 MG PO TABS *I*
10.0000 mg | ORAL_TABLET | ORAL | Status: DC | PRN
Start: 2021-12-20 — End: 2021-12-22
  Administered 2021-12-20 – 2021-12-22 (×7): 10 mg via ORAL
  Filled 2021-12-20 (×7): qty 1

## 2021-12-20 MED ORDER — DEXTROSE 5 % FLUSH FOR PUMPS *I*
0.0000 mL/h | INTRAVENOUS | Status: DC | PRN
Start: 2021-12-20 — End: 2021-12-28

## 2021-12-20 MED ORDER — DEXMEDETOMIDINE HCL 200 MCG/2ML IV SOLN WRAPPED *I*
INTRAVENOUS | Status: DC | PRN
  Administered 2021-12-20: 8 ug via INTRAVENOUS
  Administered 2021-12-20: 4 ug via INTRAVENOUS
  Administered 2021-12-20: 8 ug via INTRAVENOUS

## 2021-12-20 MED ORDER — ACETAMINOPHEN 500 MG PO TABS *I*
1000.0000 mg | ORAL_TABLET | Freq: Three times a day (TID) | ORAL | Status: DC
Start: 2021-12-20 — End: 2021-12-28
  Administered 2021-12-20 – 2021-12-28 (×25): 1000 mg via ORAL
  Filled 2021-12-20 (×25): qty 2

## 2021-12-20 MED ORDER — BISACODYL 10 MG RE SUPP *I*
10.0000 mg | Freq: Every day | RECTAL | Status: DC | PRN
Start: 2021-12-20 — End: 2021-12-28

## 2021-12-20 MED ORDER — CEFAZOLIN 1000 MG IN STERILE WATER 10ML SYRINGE *I*
PREFILLED_SYRINGE | INTRAVENOUS | Status: DC | PRN
  Administered 2021-12-20: 3000 mg via INTRAVENOUS

## 2021-12-20 MED ORDER — HALOPERIDOL LACTATE 5 MG/ML IJ SOLN *I*
0.5000 mg | Freq: Once | INTRAMUSCULAR | Status: DC | PRN
Start: 2021-12-20 — End: 2021-12-20

## 2021-12-20 MED ORDER — HYDROMORPHONE HCL PF 1 MG/ML IJ SOLN *WRAPPED*
0.5000 mg | INTRAMUSCULAR | Status: DC | PRN
Start: 2021-12-20 — End: 2021-12-20
  Administered 2021-12-20: 0.5 mg via INTRAVENOUS

## 2021-12-20 MED ORDER — ROCURONIUM BROMIDE 10 MG/ML IV SOLN *WRAPPED*
Status: DC | PRN
Start: 2021-12-20 — End: 2021-12-20
  Administered 2021-12-20: 50 mg via INTRAVENOUS
  Administered 2021-12-20: 30 mg via INTRAVENOUS

## 2021-12-20 MED ORDER — SENNOSIDES 8.6 MG PO TABS *I*
2.0000 | ORAL_TABLET | Freq: Every day | ORAL | Status: DC
Start: 2021-12-20 — End: 2021-12-28
  Administered 2021-12-20 – 2021-12-23 (×4): 2 via ORAL
  Filled 2021-12-20 (×7): qty 2

## 2021-12-20 MED ORDER — CEFAZOLIN 2000 MG IN STERILE WATER 20ML SYRINGE *I*
2000.0000 mg | PREFILLED_SYRINGE | Freq: Three times a day (TID) | INTRAVENOUS | Status: DC
Start: 2021-12-20 — End: 2021-12-21
  Administered 2021-12-20 (×2): 2000 mg via INTRAVENOUS
  Filled 2021-12-20 (×2): qty 20
  Filled 2021-12-20: qty 2000
  Filled 2021-12-20: qty 20
  Filled 2021-12-20: qty 2000

## 2021-12-20 MED ORDER — SUGAMMADEX SODIUM 100 MG/1ML IV SOLN *WRAPPED*
INTRAVENOUS | Status: DC | PRN
Start: 2021-12-20 — End: 2021-12-20
  Administered 2021-12-20: 100 mg via INTRAVENOUS

## 2021-12-20 MED ORDER — MIDAZOLAM HCL 1 MG/ML IJ SOLN *I* WRAPPED
INTRAMUSCULAR | Status: DC | PRN
Start: 2021-12-20 — End: 2021-12-20
  Administered 2021-12-20: 2 mg via INTRAVENOUS

## 2021-12-20 MED ORDER — LIDOCAINE HCL 2 % IJ SOLN *I*
INTRAMUSCULAR | Status: DC | PRN
Start: 2021-12-20 — End: 2021-12-20
  Administered 2021-12-20: 100 mg via INTRAVENOUS

## 2021-12-20 MED ORDER — HYDROMORPHONE HCL PF 1 MG/ML IJ SOLN *WRAPPED*
INTRAMUSCULAR | Status: DC | PRN
Start: 2021-12-20 — End: 2021-12-20
  Administered 2021-12-20 (×2): 1 mg via INTRAVENOUS

## 2021-12-20 MED ORDER — PROPOFOL 10 MG/ML IV EMUL (INTERMITTENT DOSING) WRAPPED *I*
INTRAVENOUS | Status: DC | PRN
Start: 2021-12-20 — End: 2021-12-20
  Administered 2021-12-20: 100 mg via INTRAVENOUS
  Administered 2021-12-20: 200 mg via INTRAVENOUS

## 2021-12-20 MED ORDER — LACTATED RINGERS IV SOLN *I*
INTRAVENOUS | Status: DC | PRN
Start: 2021-12-20 — End: 2021-12-20

## 2021-12-20 MED ORDER — SODIUM CHLORIDE 0.9 % FLUSH FOR PUMPS *I*
0.0000 mL/h | INTRAVENOUS | Status: DC | PRN
Start: 2021-12-20 — End: 2021-12-28

## 2021-12-20 MED ORDER — HYDROMORPHONE HCL PF 1 MG/ML IJ SOLN *WRAPPED*
INTRAMUSCULAR | Status: AC
Start: 2021-12-20 — End: 2021-12-20
  Filled 2021-12-20: qty 0.5

## 2021-12-20 MED ORDER — ONDANSETRON HCL 2 MG/ML IV SOLN *I*
INTRAMUSCULAR | Status: DC | PRN
Start: 2021-12-20 — End: 2021-12-20
  Administered 2021-12-20: 4 mg via INTRAVENOUS

## 2021-12-20 MED ORDER — SODIUM CHLORIDE 0.9 % IR SOLN *I*
Status: AC | PRN
Start: 2021-12-20 — End: 2021-12-20
  Administered 2021-12-20: 10000 mL

## 2021-12-20 MED ORDER — DEXAMETHASONE SODIUM PHOSPHATE 4 MG/ML INJ SOLN *WRAPPED*
INTRAMUSCULAR | Status: DC | PRN
Start: 2021-12-20 — End: 2021-12-20
  Administered 2021-12-20: 4 mg via INTRAVENOUS

## 2021-12-20 SURGICAL SUPPLY — 33 items
APPLICATOR CHLORAPREP 26ML ORANGE LARGE (Solution) ×3 IMPLANT
BANDAGE ESMARK 6IN X 3YD LF STER (Dressing) ×1 IMPLANT
CANISTER INFOVAC W/GEL 1000ML (Supply) ×1 IMPLANT
CONTAINER SPECIMEN 4OZ STERILE (Supply) ×3 IMPLANT
CONTAINER SPECIMEN 4OZ STERILE GRADUATED SCREW ON LID (Supply) ×3 IMPLANT
COUNTER NEEDLE LG 40 COUNT LF (Supply) ×1 IMPLANT
CUFF TOURNIQUET SPSB PLC 34IN STER DISP (Supply) ×1 IMPLANT
DRAPE SUR 121X90X128IN 5 LAYR EXT SMS POLYPR T ABSRB REINF CIR E FEN HK LOOP LN HLDR DISP (Drape) ×1 IMPLANT
DRAPE U-SHEET SPLIT PLASTIC STERIL (Drape) ×2 IMPLANT
DRESS XEROFORM 5X9 3605 (Dressing) ×1 IMPLANT
DRESSING COBAN STERILE 4IN (Dressing) ×1 IMPLANT
DRESSING VAC PREVENA PLUS (Dressing) ×1 IMPLANT
FILTER NEPTUNE 4PORT MANIFOLD (Supply) ×1 IMPLANT
GLOVE LINER BIOGEL INDICATOR SZ8 LTX (Glove) ×1 IMPLANT
GLOVE SURG BIOGEL SZ8 LTX STER PF (Glove) ×1 IMPLANT
IRRIGATOR SIMPULSE SUCT (Supply) ×1 IMPLANT
PACK CUSTOM ORTHO EXTREMITY CDS (Pack) ×1 IMPLANT
PACK TOWEL LIGHT BLUE STERILE (Pack) ×1 IMPLANT
PACK TOWEL ORTHO LIGHT BLUE STER (Pack) ×1 IMPLANT
PAD GROUNDING ADULT SURE FIT (Supply) ×1 IMPLANT
PENCIL SMOKE EVAC COATED PB (Supply) ×1 IMPLANT
SET TBNG GRAVITY FOUR-SPIKE (Supply) ×1 IMPLANT
SHIELD SIMPULSE SPLASH S I NL (Supply) ×1 IMPLANT
SLEEVE COMP KNEE HI MED (Supply) ×1 IMPLANT
SOL SOD CHL IRRIG .9PCT 3000ML BAG (Solution) ×3 IMPLANT
SOL SODIUM CHLORIDE IRRIG 1000ML BTL (Solution) ×1 IMPLANT
SPONGE LAPAROTOMY W18XL18IN 7IN LOOP WHITE COTTON 4 PLY RADIOPAQUE PREWASHED DELINTED (Sponge) ×1 IMPLANT
STAPLER SKIN WIDE STPL LEG L3.9MM WIRE DIA0.58MM FIX HD PROX (Supply) ×1 IMPLANT
SUCTION YANKAUER HIGH CAP. (Supply) ×1 IMPLANT
SUTR ETHILON MONO 2-0 30IN BLACK (Suture) ×5 IMPLANT
SUTR VICRYL ANTIB 1 CT-1 POP 18 VIOLET (Suture) ×2 IMPLANT
SUTR VICRYL ANTIB BRD 2-0 CP-2 18 UNDYED (Suture) ×2 IMPLANT
TIP SUCT W/FEM.IRRIG LONG (Supply) ×1 IMPLANT

## 2021-12-20 NOTE — Anesthesia Case Conclusion (Signed)
CASE CONCLUSION  Emergence  Actions:  Suctioned and extubated  Criteria Used for Airway Removal:  Adequate Tv & RR, acceptable O2 saturation and sustained tetany  Assessment:  Routine  Transport  Directly to: PACU  Airway:  Nasal cannula  Oxygen Delivery:  4 lpm  Position:  Supine  Patient Condition on Handoff  Level of Consciousness:  Alert/talking/calm  Patient Condition:  Stable  Handoff Report to:  RN

## 2021-12-20 NOTE — Progress Notes (Signed)
12/20/21 2204   Belongings   *Does patient have any belongings to document?   (SHY ffrom [facility] delivered gray bag to [facility] and Erin RN accepted bag for patient)

## 2021-12-20 NOTE — Discharge Instructions (Signed)
Discharge Antibiotics and Dose: Ertapenem 1g q24h  Antibiotic Start Date: 12/20/21  Anticipated Antibiotic Stop Date: 01/31/22     Labs to be drawn after discharge, with frequency (preferably sent to a [facility]):  Non-dialysis: weekly, CBC with differential  CMP  CRP every 2 weeks     Outpatient ID Fellow/ ID [id number] Sudnik  Outpatient ID Attending: Jeannie Done, MD    Call:  Call your Surgeon's office promptly if you experience any of these symptoms:  fever > 101.5 F, redness about incision or significant or foul drainage, cold blue toes,  numbness or tingling or weakness, chills, pain not relieved by medications.    If you cannot reach your Surgeon, then call the Answering Service (on-call phone number:  [phone]).  If you experience a medical emergency (such as chest pain or shortness of breath) then proceed directly to the Emergency Department.      Activity:  Weight bearing as tolerated right lower extremity.   Wiggle toes of affected limb.  When you are not walking, keep  leg elevated as much as possible.  Apply ice to affected extremity as needed.  Do not drive until instructed that you may do so by your physician.    Incision/Wound/Splint:    PrevenaT Incision Management System- Your PREVENAT System uses negative pressure - like a vacuum - to help protect and heal your incision at the skin surface as well as below.  -Do not turn therapy unit off unless advised by the treating physician or there is bleeding, signs of infection or signs of an allergic response  -Caution: Dressing should only be removed by or on the advice of the treating physician.  -If an alert sounds, an action is required on your part; refer to "Indicators and Alerts".   -After 7 Days the pump will no longer function, and vacuum/negative pressure will no longer be applied. At this time you can cut the tubing as close to your dressing as possible. Call your surgeons office for further instructions on dressing care. They may  transition you to a different type of dressing or ask that it remain in place until your follow up appointment.   -Please bring your pump with its canister to your follow up appointment where any output/drainage can be evaluated and the pump disposed of.    Charging and duration of treatment-  -The Prevena pump will only last 7 days. The circular array of lights on the front of the pump indicate the number of days remaining in therapy.   -The Prevena pump has battery capacity but it does need to be charged. We recommending charging the pump while at rest and near an outlet, and overnight.    Bathing  Bathing in tub is not recommended.  Do not submerge therapy unit or dressing  Light showering is permissible. Protect therapy unit from direct spray. Avoid prolonged water contact with therapy unit and dressing.  When towel drying avoid disrupting or damaging the dressing.    Cleaning  The therapy unit and carrying case can be wiped with a damp cloth using a mild household soap solution that does not contain bleach.    Sleeping  Place therapy unit in a position where tubing will not become kinked or pinched.  Ensure therapy unit cannot be pulled off table, or dropped on the floor during sleep.    Indicators and Alerts:  Visual Alerts - Flashing visual alerts will only stop when the alert condition has been corrected.  Audible  Alerts - Repeated beeps (which in some cases will increase in volume) can be temporarily muted (paused) by pressing the On/Off button once. The audible alert will re-occur after 60 minutes unless the alert condition has been corrected.  Leak Alert: one beep; one solid yellow caution indicator light - correct air leak.  Canister Full Alert: two beeps; one solid yellow caution indicator light - check canister, if nearly full or full, turn unit off and call physician.  Low Battery Alert: one slow beep; one solid yellow battery level light - change batteries within 6 hours.  Low Battery Alert: one beep  repeated rapidly and increasing in volume; one solid yellow battery level light - change battery immediately.  System Error Alert: one beep repeated rapidly and increasing in volume; two solid yellow lights (caution and battery) - turn unit off and then on.  Device Lifecyle Expired: a beep for 15seconds with three solid yellow lights; unit shuts itself down.    Please keep and use the Prevena care booklet for troubleshooting and additional questions- or call your surgeons office.    **It is important that you ensure you have the charging cord prior to leaving the hospital**     If you have a home care agency and have non-life threatening questions (for example, regarding dressings, wound care, specific activities or needs of daily living), you may contact your home agency as a first step.      Dr. Drinda Butts :  appointments [phone], office [phone]    Disposing of Medications and Sharps:  Medications:    Call [phone] for a list of pharmaceutical disposal locations  OR  Visit [email or web address]  Sharps:  All hospitals and nursing homes in [place] are mandated by law to accept home-generated sharps as a free community service.    Our outpatient offices are not equipped to accept these items.

## 2021-12-20 NOTE — Progress Notes (Signed)
06-3398 ADMISSION NOTE_________________________________    Leticia Penna arrived on 06-3398 from PACU at 1500.    Vital signs stable. CMS checks WNL. Pain currently: 8/10    Current Lines/Drains/Braces/Collar: HTWB    Patient arrived to unit with (belongings): no belongings to report    COVID-19 STATUS_________________________________________    Negative or Pending?:     SAFETY________________________________________________    Masimo Safety Net System monitoring initiated per nursing protocol.     Patient oriented to unit, room and visiting hours.     Patient Educated on Safety & Fall Prevention Provided:  Using call light for assistance.  Activity orders/restrictions/precautions.  Mobilizing with staff assistance at all times.  Using safety equipment at all times.  Wearing hospital approved footwear for safe mobilization at all times.     Patient response to education: pt verbalizes understanding    SKIN___________________________________________________    Four eyed skin assessment completed with:  Bernette Mayers, RN  Assessment findings include:  No skin issues noted    Interventions include:    WOCN consulted:   Q2 Hour Turning/Repositioning Needed:  Low Air Loss Ordered:   Heels elevated and floating:  Patient incontinent of urine:     Wound present:   Dressing change order present:  ______________________________________________________    See doc flow sheets and MAR for full assessment and interventions.    Hill Underdahl acknowledged understanding of current treatment plan.    Eddiel Than is currently resting with call bell in reach.    Signed by: Rochele Raring, RN as of 12/20/2021 at 3:57 PM
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
## 2021-12-20 NOTE — INTERIM OP NOTE (Signed)
S/p RLE removal of hardware, I&D  Planned return to operating room: none  Antibiotic regimen: IV Ancef, will consult ID   Pain Management: multimodal   DVT prophylaxis: Lovenox   Weight bearing status and activity restrictions: protected WB RLE until completion of CT. HTWB  Wound Care: Dressing change POD3, prevena in place distal incision  Postoperative x-rays and radiographic studies: R tibia films, CT R tibia  Diet: regular  Will follow cultures      Interim Op Note (Surgical Log ID: [id number])       Date of Surgery: 12/20/2021       Surgeons: Surgeon(s) and Role:     * Jerolyn Center, MD - Primary     * Dorris Carnes, MD - Resident - Assisting     * Wyvonna Plum, MD - Resident - Assisting   Assistants:         Pre-op Diagnosis: Pre-Op Diagnosis Codes:     * Leg abscess [L02.419]       Post-op Diagnosis: Post-Op Diagnosis Codes:     * Leg abscess [L02.419]       Procedure(s) Performed: Procedure(s) (LRB):  RIGHT LEG I/D, REMOVAL OF HARDWARE (Right)       Anesthesia Type: General        Fluid Totals: I/O this shift:  11/06 0700 - 11/06 1459  In: 900 (7.1 mL/kg) [I.V.:900]  Out: 20 (0.2 mL/kg) [Blood:20]  Net: 880  Weight: 127 kg        Estimated Blood Loss: 20 mL       Specimens to Pathology:  ID Type Source Tests Collected by Time Destination   1 : Abscess proximal tibia bone TISSUE Tissue ANAEROBIC CULTURE, FUNGUS CULTURE, AEROBIC CULTURE Tenna Child, RN 12/20/2021 4540    2 : Abscess distal tibia bone TISSUE Tissue ANAEROBIC CULTURE, FUNGUS CULTURE, AEROBIC CULTURE Tenna Child, RN 12/20/2021 0940    3 : distal Tibia Bone TISSUE Tissue ANAEROBIC CULTURE, FUNGUS CULTURE, AEROBIC CULTURE Tenna Child, RN 12/20/2021 [phone]    4 : Proximal Tibia Bone TISSUE Tissue ANAEROBIC CULTURE, FUNGUS CULTURE, AEROBIC CULTURE Tenna Child, RN 12/20/2021 [phone]    5 : proximal medial tibia TISSUE Tissue ANAEROBIC CULTURE, FUNGUS CULTURE, AEROBIC CULTURE Tenna Child, RN 12/20/2021 1019    6 : right knee TISSUE Tissue  ANAEROBIC CULTURE, FUNGUS CULTURE, AEROBIC CULTURE Tommi Rumps, RN 12/20/2021 1109           Temporary Implants:        Packing:                 Patient Condition: good       Findings (Including unexpected complications): See op note     Signed:  Dorris Carnes, MD  on 12/20/2021 at 12:01 PM

## 2021-12-20 NOTE — Progress Notes (Addendum)
PT note      12/20/21 0800   Evaluation Attempted   Evaluation Attempt Comment PT order received, chart review completed. Patient WSOR for RLE I&D, possible removal of hardware. Will D/C PT order at this time, please re-order post-op. Thank you.       Lauris Poag, PT, DPT    Please contact physical therapist on the treatment team via secure chat or via the secure chat group for your unit Physical Therapist with any questions.  On weekends, please utilize the secure chat group, [facility] 1st call, to contact PT.
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
## 2021-12-20 NOTE — Anesthesia Procedure Notes (Signed)
---------------------------------------------------------------------------------------------------------------------------------------    AIRWAY   GENERAL INFORMATION AND STAFF    Patient location during procedure: OR       Date of Procedure: 12/20/2021 8:53 AM  CONDITION PRIOR TO MANIPULATION     Current Airway/Neck Condition:  Normal        For more airway physical exam details, see Anesthesia PreOp Evaluation  AIRWAY METHOD     Patient Position:  Sniffing    Preoxygenated: yes      Maintained In-Line Stability: not needed, normal c-spine condition          To see details of medications used, see MAR    Induction: IV    Mask Difficulty Assessment:  1 - vent by mask      Technique Used for Successful ETT Placement:  Direct laryngoscopy    Devices/Methods Used in Placement:  Intubating stylet    Blade Type:  Miller    Laryngoscope Blade/Video laryngoscope Blade Size:  2    Cormack-Lehane Classification:  Grade I - full view of glottis    Placement Verified by: capnometry and auscultation      Number of Attempts at Approach:  1  FINAL AIRWAY DETAILS    Final Airway Type:  Endotracheal airway    Final Endotracheal Airway:  ETT      Cuffed: cuffed    Insertion Site:  Oral    ETT Size (mm):  7.5    Distance inserted from Lips (cm):  22  ----------------------------------------------------------------------------------------------------------------------------------------
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
## 2021-12-20 NOTE — Anesthesia Preprocedure Evaluation (Addendum)
Anesthesia Pre-operative History and Physical for [name]    Highlighted Issues for this Procedure:  36 y.o. male with Leg abscess [L02.419] presenting for Procedure(s) (LRB):  RIGHT LEG I/D, REMOVAL OF HARDWARE (Right) by Surgeon(s):  Jerolyn Center, MD scheduled for 150 minutes.  BMI Readings from Last 1 Encounters:  12/19/21 : 39.05 kg/m      PMHx of R tibial plateau ORIF 2021 now complicated by a right proximal tibia abscess    .  Marland Kitchen  Anesthesia Evaluation Information Source: patient, records     ANESTHESIA HISTORY  Pertinent(-):  No History of anesthetic complications or Family hx of anesthetic complications    GENERAL    + Obesity          upper body, central, lower body  Pertinent (-):  No history of anesthetic complications or Family Hx of Anesthetic Complications     PULMONARY    + Smoker          tobacco currently, vape currently  Pertinent(-):  No asthma, recent URI or COPD    CARDIOVASCULAR  Good(4+METs) Exercise Tolerance  Pertinent(-):  No hypertension or angina    GI/HEPATIC/RENAL   NPO: > 8hrs ago (solids) and > 2hrs ago (clears)    Pertinent(-):  No GERD  NEURO/PSYCH/ORTHO  Pertinent(-):  No seizures or cerebrovascular event    ENDO/OTHER  Pertinent(-):  No diabetes mellitus         Physical Exam    Airway            Mouth opening: normal            Mallampati: II            TM distance (fb): >3 FB            Neck ROM: full            Airway Impression: easy     Cardiovascular  Normal Exam         Pulmonary   Normal Exam    Mental Status     oriented to person, place and time         ________________________________________________________________________  PLAN  ASA Score  3  Anesthetic Plan general       Induction (routine IV) General Anesthesia/Sedation Maintenance Plan (inhaled agents);  Airway Manipulation (direct laryngoscopy); Airway (cuffed ETT); Line ( use current access); Monitoring (standard ASA); Positioning (supine); PONV Plan (dexamethasone and ondansetron); Pain (per surgical  team); PostOp (PACU)Standard Attestation    Informed Consent     Risks:          Risks discussed were commensurate with the plan listed above with the following specific points: N/V, aspiration, sore throat and hypotension, Damage to: eyes, nerves and teeth, allergic Rx and unexpected serious injury.    Anesthetic Consent:         Anesthetic plan (and risks as noted above) were discussed with patient    Blood products Consent:        Use of blood products discussed with: patient and they consented    Plan also discussed with team members including:       CRNA    Responsible Anesthesia Provider Attestation:  I attest that the patient or proxy understands and accepts the risks and benefits of the anesthesia plan. I also attest that I have personally performed a pre-anesthetic examination and evaluation, and prescribed the anesthetic plan for this particular location within 48 hours prior to the anesthetic as documented. [employer]  Welton Flakes, MD  12/20/21, 7:48 AM

## 2021-12-20 NOTE — Anesthesia Postprocedure Evaluation (Signed)
Anesthesia Post-Op Note    Patient: [name]    Procedure(s) Performed:  Procedure Summary  Date:  12/20/2021 Anesthesia Start: 12/20/2021  8:23 AM Anesthesia Stop: 12/20/2021 11:59 AM Room / Location:  S_OR_24 / [facility] MAIN OR   Procedure(s):  RIGHT LEG I/D, REMOVAL OF HARDWARE Diagnosis:  Leg abscess [L02.419] Surgeon(s):  Jerolyn Center, MD  Wyvonna Plum, MD  Dorris Carnes, MD Responsible Anesthesia Provider:  Sallee Lange, MD         Recovery Vitals  BP: 133/84 (12/20/2021  1:34 PM)  Heart Rate: 90 (12/20/2021  1:34 PM)  Heart Rate (via Pulse Ox): 91 (12/20/2021  1:34 PM)  Resp: 17 (12/20/2021  1:34 PM)  Temp: 36.3 C (97.3 F) (12/20/2021  1:00 PM)  SpO2: 95 % (12/20/2021  1:34 PM)  O2 Flow Rate: 4 L/min (12/20/2021 12:15 PM)   0-10 Scale: 7 (12/20/2021  6:49 AM)    Anesthesia type:  general  Complications Noted During Procedure or in PACU:  None   Comment:    Patient Location:  PACU  Level of Consciousness:    Awake  Patient Participation:     Able to participate  Temperature Status:    Normothermic  Oxygen Saturation:    Appropriate for condition  Cardiac Status:   appropriate for condition  Fluid Status:    Stable  Airway Patency:     Yes  Pulmonary Status:    Stable  Pain Management:    Adequate analgesia  Nausea and Vomiting:  None    Post Op Assessment:    Tolerated procedure well  Responsible Anesthesia Provider Attestation:  All indicated post anesthesia care provided       -

## 2021-12-20 NOTE — Progress Notes (Signed)
Orthopaedic Surgery Progress Note  Patient:[name]  MRN: [id number]  DOA: 12/19/2021  Ortho Progress Note for 12/20/2021    Subjective: Patient seen for post op check. Pain controlled. Denies numbness/tingling of the extremities. Denies f/c/n/v/CP/SOB. Discussed protected weight bearing until CT completed. No other complaints    Objective:  Temp:  [35.7 C (96.3 F)-36.6 C (97.9 F)] 35.7 C (96.3 F)  Heart Rate:  [74-96] 79  Resp:  [14-26] 19  BP: (124-159)/(67-97) 155/90  Patient Vitals for the past 24 hrs:   BP Temp Temp src Pulse Resp SpO2   12/20/21 1432 155/90 35.7 C (96.3 F) TEMPORAL 79 19 96 %   12/20/21 1400 138/89 -- -- -- -- 94 %   12/20/21 1345 138/67 -- -- 94 19 91 %   12/20/21 1334 133/84 -- -- 90 17 95 %   12/20/21 1330 133/84 -- -- 83 20 93 %   12/20/21 1315 (!) 148/95 -- -- 96 (!) 26 96 %   12/20/21 1300 159/90 36.3 C (97.3 F) -- 75 19 96 %   12/20/21 1259 159/90 -- -- 74 14 97 %   12/20/21 1245 (!) 148/94 -- -- 74 17 98 %   12/20/21 1230 (!) 148/92 -- -- 74 18 96 %   12/20/21 1215 155/86 -- -- 76 19 96 %   12/20/21 1200 154/85 -- -- 75 19 96 %   12/20/21 1155 (!) 158/93 36.2 C (97.2 F) TEMPORAL 78 16 96 %   12/20/21 0547 125/79 36.2 C (97.2 F) TEMPORAL 95 15 99 %   12/20/21 0130 124/72 36.2 C (97.2 F) TEMPORAL 92 16 100 %   12/19/21 2118 145/90 36.6 C (97.9 F) Oral 90 18 99 %   12/19/21 1948 (!) 145/97 36.1 C (97 F) -- 87 20 99 %   12/19/21 1531 133/85 36.4 C (97.5 F) TEMPORAL 88 16 98 %     Recent Labs   Lab 12/19/21  0846   WBC 7.9   Hemoglobin 14.3   Hematocrit 43   Platelets 296     Recent Labs   Lab 12/19/21  0846   Sodium 140   Potassium 4.8   Chloride 106   CO2 21     No components found with this basename: BUN, LABGLOM, CALCIUM    Recent Labs   Lab 12/19/21  0846   INR 1.0     Recent Labs   Lab 12/19/21  0846   Sedimentation Rate 64*   CRP 70*     Date 12/19/21 0700 - 12/20/21 0659 12/20/21 0700 - 12/21/21 0659   Shift 0700-0659 24 Hour Total 0700-0659 24 Hour Total    INTAKE   P.O.   300 300     P.O.   300 300   I.V.   900 900     Volume (mL) (Lactated Ringers Infusion)   900 900   Shift Total(mL/kg)   1200(9.4) 1200(9.4)   OUTPUT   Urine 350 350 400 400     Urine 350 350 400 400     Urine Occurrence 1 x 1 x 1 x 1 x   Blood   20 20     Blood Loss   20 20   Shift Total(mL/kg) 350 350 420(3.3) 420(3.3)   NET -350 -350 780 780   Weight (kg)   127 127       Current Medications:   docusate sodium  200 mg Oral Nightly    senna  2 tablet Oral Daily    ceFAZolin IV  2,000 mg Intravenous Q8H    acetaminophen  1,000 mg Oral Q8H    [START ON 12/21/2021] polyethylene glycol  17 g Oral Daily    enoxaparin  40 mg Subcutaneous Q24H        oxyCODONE **OR** oxyCODONE, sodium chloride, dextrose, bisacodyl, HYDROmorphone hcl, sodium chloride, dextrose    Exam:  NAD  No respiratory distress    RLE: Dressing c/d/I, prevena in place and holding suction  SILT sp/dp/saph/sur/tib nerve distributions  SILT over tips of toes  +ADF/APF/EHL/Toe f/e  Brisk CR, palpable DP/PT    Imaging: Pending     Assessment and Plan  36 y.o. male admitted on 12/19/2021 now POD#Day of Surgery s/p R tibia ROH and I&D. NVI.    Antibiotic regimen: IV Ancef, ID consulted. Will follow cultures   Pain Management: multimodal   DVT prophylaxis: Lovenox   Weight bearing status and activity restrictions: protected WB RLE until completion of CT. HTWB.  Wound Care: Dressing change POD3, prevena in place distal incision  Postoperative x-rays and radiographic studies: R tibia films, CT R tibia  Diet: regular  PT/OT/OOB  Dispo: pending pain/PT/abx plan     Dorris Carnes, MD   Orthopaedic Surgery Resident  12/20/2021  3:23 PM

## 2021-12-21 DIAGNOSIS — T847XXA Infection and inflammatory reaction due to other internal orthopedic prosthetic devices, implants and grafts, initial encounter: Secondary | ICD-10-CM

## 2021-12-21 LAB — CBC
Hematocrit: 39 % (ref 37–52)
Hemoglobin: 13.4 g/dL (ref 12.0–17.0)
MCH: 30 pg (ref 26–32)
MCHC: 34 g/dL (ref 32–37)
MCV: 88 fL (ref 75–100)
Platelets: 294 10*3/uL (ref 150–450)
RBC: 4.4 MIL/uL (ref 4.0–6.0)
RDW: 14 % (ref 0.0–15.0)
WBC: 8.5 10*3/uL (ref 3.5–11.0)

## 2021-12-21 LAB — FUNGAL STAIN
Fungal Stain: 0
Fungal Stain: 0
Fungal Stain: 0

## 2021-12-21 LAB — SEDIMENTATION RATE, AUTOMATED: Sedimentation Rate: 90 mm/hr — ABNORMAL HIGH (ref 0–15)

## 2021-12-21 MED ORDER — PIPERACILLIN/TAZOBACTAM 4.5 G IN NS MINI-BAG PLUS 110 ML *I*
4.5000 g | Freq: Three times a day (TID) | INTRAVENOUS | Status: DC
Start: 2021-12-21 — End: 2021-12-22
  Administered 2021-12-21 – 2021-12-22 (×4): 4.5 g via INTRAVENOUS
  Filled 2021-12-21 (×4): qty 110

## 2021-12-21 MED ORDER — PIPERACILLIN/TAZOBACTAM 4.5 G IN NS MINI-BAG PLUS 110 ML *I*
4.5000 g | Freq: Once | INTRAVENOUS | Status: AC
Start: 2021-12-21 — End: 2021-12-21
  Administered 2021-12-21: 4.5 g via INTRAVENOUS
  Filled 2021-12-21: qty 110

## 2021-12-21 NOTE — Progress Notes (Signed)
PT note      12/21/21 1500   Evaluation Attempted   Evaluation Attempt Comment PT Order received, chart review completed. Patient has refused PT eval x2 this date. Will re-attempt as time allows. Thank you.       Lauris Poag, PT, DPT    Please contact physical therapist on the treatment team via secure chat or via the secure chat group for your unit Physical Therapist with any questions.  On weekends, please utilize the secure chat group, [facility] 1st call, to contact PT.

## 2021-12-21 NOTE — Anesthesia Postprocedure Evaluation (Signed)
Anesthesia Post-Op Note    Patient: [name]    Procedure(s) Performed:  Procedure Summary  Date:  12/20/2021 Anesthesia Start: 12/20/2021  8:23 AM Anesthesia Stop: 12/20/2021 11:59 AM Room / Location:  S_OR_24 / [facility] MAIN OR   Procedure(s):  RIGHT LEG I/D, REMOVAL OF HARDWARE Diagnosis:  Leg abscess [L02.419] Surgeon(s):  Jerolyn Center, MD  Wyvonna Plum, MD  Dorris Carnes, MD Responsible Anesthesia Provider:  Sallee Lange, MD         Recovery Vitals  BP: 136/83 (12/21/2021  6:18 AM)  Heart Rate: 68 (12/21/2021  6:18 AM)  Heart Rate (via Pulse Ox): 78 (12/20/2021  2:32 PM)  Resp: 18 (12/21/2021  6:18 AM)  Temp: 36.5 C (97.7 F) (12/21/2021  6:18 AM)  SpO2: 96 % (12/21/2021  6:18 AM)  O2 Flow Rate: 4 L/min (12/20/2021 12:15 PM)   0-10 Scale: 5 (12/21/2021  6:18 AM)    Anesthesia type:  general  Complications Noted During Procedure or in PACU:  None   Comment:    ""     Complications Noted During Recovery Period:      None  Recovery from Anesthesia:      Recovered to baseline level of consciousness  Condition of patient:      Recovered to pre-anesthetic condition

## 2021-12-21 NOTE — Progress Notes (Signed)
Orthopaedic Surgery Progress Note  Patient:[name]  MRN: [id number]  DOA: 12/19/2021  Ortho Progress Note for 12/21/2021    Subjective: NAEON. VSS. Pain controlled. Denies numbness/tingling of the extremities. Denies n/v/CP/SOB. CT . No other complaints.  WBC. 11.3. CRP 92.     Cxs:   RLE abscess w/ gram negative bacilli   Intra-op cxs w/ NGTD     Objective:  Temp:  [35.7 C (96.3 F)-36.3 C (97.3 F)] 36.3 C (97.3 F)  Heart Rate:  [65-96] 65  Resp:  [14-26] 17  BP: (116-161)/(66-95) 116/66  Patient Vitals for the past 24 hrs:   BP Temp Temp src Pulse Resp SpO2   12/21/21 0300 116/66 36.3 C (97.3 F) Oral 65 17 97 %   12/20/21 2136 144/81 36.3 C (97.3 F) TEMPORAL 85 16 93 %   12/20/21 1900 161/82 35.7 C (96.3 F) TEMPORAL 80 18 97 %   12/20/21 1432 155/90 35.7 C (96.3 F) TEMPORAL 79 19 96 %   12/20/21 1400 138/89 -- -- -- -- 94 %   12/20/21 1345 138/67 -- -- 94 19 91 %   12/20/21 1334 133/84 -- -- 90 17 95 %   12/20/21 1330 133/84 -- -- 83 20 93 %   12/20/21 1315 (!) 148/95 -- -- 96 (!) 26 96 %   12/20/21 1300 159/90 36.3 C (97.3 F) -- 75 19 96 %   12/20/21 1259 159/90 -- -- 74 14 97 %   12/20/21 1245 (!) 148/94 -- -- 74 17 98 %   12/20/21 1230 (!) 148/92 -- -- 74 18 96 %   12/20/21 1215 155/86 -- -- 76 19 96 %   12/20/21 1200 154/85 -- -- 75 19 96 %   12/20/21 1155 (!) 158/93 36.2 C (97.2 F) TEMPORAL 78 16 96 %   12/20/21 0547 125/79 36.2 C (97.2 F) TEMPORAL 95 15 99 %       Recent Labs   Lab 12/20/21  2236 12/19/21  0846   WBC 11.3* 7.9   Hemoglobin 13.9 14.3   Hematocrit 41 43   Platelets 302 296       Recent Labs   Lab 12/20/21  2236 12/19/21  0846   Sodium 138 140   Potassium 4.5 4.8   Chloride 103 106   CO2 23 21     No components found with this basename: BUN, LABGLOM, CALCIUM    Recent Labs   Lab 12/19/21  0846   INR 1.0       Recent Labs   Lab 12/20/21  2236 12/19/21  0846   Sedimentation Rate 76* 64*   CRP 92* 70*       Date 12/20/21 0700 - 12/21/21 0659 12/21/21 0700 - 12/22/21 0659    Shift 0700-0659 24 Hour Total 0700-0659 24 Hour Total   INTAKE   P.O. 420 420       P.O. 420 420     I.V.(mL/kg/hr) 900 900       Volume (mL) (Lactated Ringers Infusion) 900 900     Shift Total(mL/kg) 1320(10.4) 1320(10.4)     OUTPUT   Urine(mL/kg/hr) 1750 1750       Urine 1750 1750       Urine Occurrence 1 x 1 x     Drains 0 0       Output (ml) (Negative Pressure Wound Therapy Leg Anterior;Distal;Lower;Right) 0 0     Blood 20 20       Blood Loss  20 20     Shift Total(mL/kg) 1770(13.9) 1770(13.9)     NET -450 -450     Weight (kg) 127 127 127 127         Current Medications:   docusate sodium  200 mg Oral Nightly    senna  2 tablet Oral Daily    ceFAZolin IV  2,000 mg Intravenous Q8H    acetaminophen  1,000 mg Oral Q8H    polyethylene glycol  17 g Oral Daily    enoxaparin  40 mg Subcutaneous Q24H        oxyCODONE **OR** oxyCODONE, sodium chloride, dextrose, bisacodyl, HYDROmorphone hcl, sodium chloride, dextrose    Exam:  NAD  No respiratory distress    RLE: Dressing c/d/I, prevena in place and holding suction  SILT sp/dp/saph/sur/tib nerve distributions  SILT over tips of toes  +ADF/APF/EHL/Toe f/e  Brisk CR, palpable DP/PT    Imaging:   CT R tib/fib: Healing fracture, post surgical changes w/ some none specific fluid pockets and subcutaneous emphysema     Assessment and Plan  36 y.o. male admitted on 12/19/2021 now POD#1 s/p R tibia ROH and I&D. NVI.    Antibiotic regimen: Zosyn, plan for full ID consult in AM.   Pain Management: multimodal   DVT prophylaxis: Lovenox   Weight bearing status and activity restrictions: protected WB RLE until attending discussion  Wound Care: Dressing change POD3, prevena in place distal incision  Postoperative x-rays and radiographic studies: R tibia films, CT R tibia complete  Diet: regular  PT/OT/OOB  Dispo: pending pain/PT/abx plan     Zerita Boers, MD   Orthopaedic Surgery Resident  12/21/2021  5:22 AM

## 2021-12-21 NOTE — Progress Notes (Signed)
Occupational Therapy Evaluation        Discharge Recommendations:   Pt is functioning below level of baseline, at time of OT evaluation was limited by pain response thus requiring hands on assist for all self-care and limited mobility tasks. Anticipate with pain control and progress during inpatient stay, pt will d/c home with caregiver setup for AM/PM ADLs and assist with IADLs. Will continue to follow during admission to progress as able.    OT Discharge Equipment Recommended: (P)  (TBD closer to date of d/c, potentially tub bench and 3:1 commode, RW.)     Hospital Stay Recommendations:  Encourage active participation with ADLs, Toilet transfer status 1A SPT to commode vs short distance amb with RW, and OOB for meals    OT Referral Recommendations : (P) Physical Therapy        HPI:  Admitting Dx: Active Hospital Problems    s/p RLE [facility], I&D 12/20/21        PMH: History reviewed. No pertinent past medical history.    PSH: History reviewed. No pertinent surgical history.    ASSESSMENT       12/21/21 0820   Visit Details [facility])   Visit Type ([facility]) Eval-General   Tx Prioritization 2 - High   Current Pain Assessment   Pain Assessment / Reassessment Assessment   Pain Scale 0-10 (Numeric Scale for Pain Intensity)    0-10 Scale 7   Pain Location/Orientation Leg Right   (T) Pain Intervention(s) for current pain Repositioned   Plan and Onset date   Plan of Care Date 12/21/21   Onset Date 12/19/21   Treatment Start Date 12/21/21   OT Last Visit   Visit (#)  1   Precautions   Precautions used Yes   Weight Bearing Status RLE PWB  (50% WB through RLE)   Brace Applied HTWB  (RLE HTWB)   Fall Precautions General falls precautions   LDA Observation IV lines;VAC   Patient Wearing Mask No   Writer wearing PPE including Gloves;Mask   Activity Order Activity as tolerated   Home Living (Prior to Admission)   Prior Living Situation Reported by patient   Type of Home [place]   # Steps to Enter Home 4   # Of Steps In Home 0   Location  of Bedroom First floor   Location of Bathroom First floor   Rails to Enter Yes   Bathroom Shower/Tub Tub/Shower unit  (reports has small tub/sh and low height toilet- unsure if it would accommodate tub trx bench)   Current Home Equipment Cane (straight);Wheelchair (manual);Grab bars in shower/tub;Grab bars around toilet   Prior Function   Prior Function Reported by patient   Level of Independence Independent with ADLs;Independent ambulation;Independent with homemaking with ambulation;Driving   Lives With Spouse;Child  (3 children, 12/8/1)   Receives Help From Independent   IADL Independent   Vocational Unemployed   Additional Comments Pt reports PTA he was fully independent after sustaining [facility] in 2021. Lives at home with spouse and 3 kids, spouse works full time during the day, and due to limited available social supports, pt would be alone during daytime working hours.   Vision    Current Vision Does not wear corrective lenses   Cognition   Additional Comments Pt is alert and oriented, cooperative during session. Maintains WB restrictions after education. Pt is with flat affect and somewhat avoidant behavior during conversation and with eye contact. Reports lack of sleep overnight.   Medication Management  Additional Comments none taken PTA   Perception   Perception No deficit noted   Coordination   Coordination Finger Opposition   Finger Opposition Within Normal Limits   Sensation   Sensation No apparent deficit   UE Assessment   UE Assessment Full AROM RUE;Full AROM LUE   Additional Comments BUE WFL for strength and ROM   Bed Mobility   Supine to Sit Minimal Assist;HOB elevated;Use of bed rail   Additional Comments Min A to guide RLE over EOB due to pain response, increased time/effort to fully position hips at EOB due to knee pain, which is wrapped with ace, pt maintains knee extension throughout.   Functional Transfers   Sit to Stand Contact guard;Rolling walker   Stand to Sit Contact guard;Rolling walker    Functional Mobility Contact Guard;Rolling Walker   Additional Comments Pt completes STS from EOB, and is able to maintain 50% WB through RLE with use of RW. Immediate increased in pain once in stance in HTWB, ultimately limiting mobility during OT evaluation. Pt is only able to tolerate small steps/turns towards recliner, which pt descends into sitting with CGA. Left in recliner with all needs within reach, RLE positioned to comfort.   Balance   Sitting - Static Independent    Sitting - Dynamic Supervision   Standing - Static Contact Guard;Supported   Standing - Dynamic Supported   Additional Comments Pt was unable to briefly remove B UE supports from RW in standing for simulated ADL tasks. No gross LOB noted. Has difficulty with dynamic sitting at this time due to RLE pain.   ADL Assessment   Eating Independent   Grooming Set up   Where Grooming Assessed Chair   UE Dressing Set up   LE Dressing Maximum Assist   Assist Needed With: Decreased balance;Setup;Pants over feet;Pants up to waist;Increased time;Hemi tech;Decreased safety;Socks;Use of adaptive equipment   Where  LE Dressing Assessed Edge of bed;Standing   Equipment Used Walker   Bathing Moderate Assist  (estimate, seated)   Toileting Maximal Assist  (simulated vs setup for urinal use)   Additional Comments Pt considerably limited by pain response this session, estimations above based on observed activity tolerance, weight bearing status, mobility, strength and endurance. In future sessions will address LB hemidressing and potential AE use. Attempted to educate regarding appropriate bathroom DME (tub trx and 3:1 commode vs RTS) however pt not appearing receptive at this time.   Activity Tolerance   Endurance Tolerates 30 min activity with multiple rests   OT Functional Outcome Measures   Functional Outcome Measures Yes   OT AM-PAC Self Care   Putting on and taking off regular lower body clothing? 2   Bathing (including washing, rinsing, drying)? 2    Toileting, which includes using toilet, bedpan, or urinal? 2   Putting on and taking off regular upper body clothing? 3   Taking care of personal grooming such as brushing teeth? 3   Eating Meals 4   Total Raw Score 16   CMS Score - Calculated 53.32%   Assessment   Assessment Impaired ADL status;Impaired balance;Impaired endurance;Impaired self-care transfers;Impaired instrumental ADL's   Plan   OT Frequency 3-5x/wk   Patient Will Benefit From ADL retraining;Functional transfer training;Endurance training;Equipment eval/education;Gross motor activities;Compensatory technique education;IADL training;Exercises;Community re-entry   Additional Comments Pt is functioning below level of baseline, at time of OT evaluation was limited by pain response thus requiring hands on assist for all self-care and limited mobility tasks. Anticipate with pain control  and progress during inpatient stay, pt will d/c home with caregiver setup for AM/PM ADLs and assist with IADLs. Will continue to follow during admission to progress as able.   Multidisciplinary Communication   Multidisciplinary Communication RN, pt,PT, APP   Recommendation   OT Discharge Recommendations Prior Living Environment;Discharge Equipment (comment)   OT Referral Recommendations  Physical Therapy   OT Discharge Equipment Recommended   (TBD closer to date of d/c, potentially tub bench and 3:1 commode, RW.)   OT needs to see patient prior to DC  No          OCCUPATIONAL THERAPY PROVIDER     Electronically Signed By:   Lawerance Cruel, OT    Please contact the OT via Secure Chat to: [facility] Occupational Therapy First Call with any questions/concerns and/or update requests.    Timed Calculations:  Timed Codes:  0  Untimed Codes: 25 OT Eval   Unbilled Time: 0  Total Time:  25 minutes     OT EVALUATION COMPLEXITY     1.  Occupational Profile & History (Medical/Therapy)   Moderate (Expanded Review)    2.  Public relations account executive (Includes occupations from: ADL, IADL,  Rest/Sleep, Education, Work, Copywriter, advertising, Leisure, Social Participation)   Moderate (3-5 occupations/performance deficits)    3.  Clinical Decision Making   i  Assessment    Moderate (Detailed)   ii  Co-Morbidities    Moderate (1+)   iii  Modifications    Moderate (Minimum - Moderate)   iv Treatment Options (Approaches include: Create, Promote, Establish, Restore, Maintain, Modify, Prevent)    Moderate (Several)     Moderate    4.  EVALUATION COMPLEXITY as based on the above provided information   Moderate

## 2021-12-21 NOTE — Addendum Note (Signed)
Addendum  created 12/21/21 0745 by Willadean Carol, MD    Clinical Note Signed

## 2021-12-21 NOTE — Continuity of Care (Signed)
Met with patient at bedside. Confirmed demographics on FS. Per patient, they do not have a  current PCP or active insurance. Writer let SW know that patient does not currently have insurance and will need to meet with a FCM while inpatient. Writer offered [facility] which patient was agreeable to. Will reach out to [facility] closer to discharge to arrange a NPV for patient.     Team also aware that patient does not have a PCP / insurance as Clinical research associate was notified that patient potentially may need to discharge home on IV abx therapy.     ACC will continue to follow patient's hospital course until discharge plans are finalized.     Lazarus Salines, RN   Acute Care Coordinator  Cell: [phone]  Weekend covering Coordinator: [phone]

## 2021-12-21 NOTE — Progress Notes (Signed)
12/21/21 0937   [facility] Patient Class Review   Patient Class Review Inpatient     Patient class effective as of 12/19/21.     Clarita Crane, RN BSN  Utilization Management  [phone]

## 2021-12-21 NOTE — Plan of Care (Signed)
Problem: Impaired ADL  Goal: OT STG-Other  Note: In 2-3 sessions, pt. will demonstrate mod I with functional mobility to and from bathroom with use of AE.    In 2-3 sessions, pt. will demonstrate mod I with LB dressing with use of AE.     In 2-3 sessions, pt. will demonstrate mod I with toileting.

## 2021-12-21 NOTE — Consults (Addendum)
SOCIAL WORK REFERRAL NOTE:    SW referred to patient to assist with discharge planning. Patient's presenting situation and hospital course discussed in Health Team Rounds. Per chart, patient is a 36 y.o. male admitted on 12/19/2021 now POD#1 s/p R tibia ROH and I&D. NVI.Patient pending pain/PT/abx plan. Per discussion with unit ACC, patient reports he does not have any health insurance. SW met with patient this afternoon to introduce the social work role and follow up regarding health insurance. Patient confirmed he does not have active health insurance and he does not work. Patient is agreeable with SW submitting a Medicaid referral. Patient reports he does not have any other SW needs at this time.     Per OT note, patient will be a home plan. Awaiting PT evaluation for discharge recommendations.    Medicaid referral will be submitted.     SW will continue to follow patient's hospital course and remains available to assist if needs arise.    Addendum: SW observed in patient's e-record financial summary that a Medicaid referral is in progress. SW will reach out to [facility] via e-mail.    Addendum: FCM completed Medicaid application with patient by phone this afternoon. Per [facility], "Medicaid CIN: [id number] but it will take 24 hours to show effective in EPACES. He will have [facility] eff 01/14/22." SW met with patient to obtain his signature, a copy of his ID and SS card per [facility] request. SW sent to [facility] via e-mail.    No further SW needs identified at this time.    Desma Paganini, LMSW  Orthopaedics [phone]  [phone]  PICC [phone]

## 2021-12-22 ENCOUNTER — Inpatient Hospital Stay: Payer: MEDICAID

## 2021-12-22 LAB — BODY FLUID CELL COUNT: Segs %,Fl: 97 %

## 2021-12-22 LAB — AEROBIC CULTURE
Aerobic Culture: 0
Aerobic Culture: 0

## 2021-12-22 LAB — BASIC METABOLIC PANEL
Anion Gap: 12 (ref 7–16)
Anion Gap: 12 (ref 7–16)
CO2: 25 mmol/L (ref 20–28)
CO2: 23 mmol/L (ref 20–28)
Calcium: 8.8 mg/dL — ABNORMAL LOW (ref 9.0–10.3)
Calcium: 9.1 mg/dL (ref 9.0–10.3)
Chloride: 103 mmol/L (ref 96–108)
Chloride: 104 mmol/L (ref 96–108)
Creatinine: 0.93 mg/dL (ref 0.67–1.17)
Creatinine: 0.82 mg/dL (ref 0.67–1.17)
Glucose: 97 mg/dL (ref 60–99)
Glucose: 102 mg/dL — ABNORMAL HIGH (ref 60–99)
Lab: 13 mg/dL (ref 6–20)
Lab: 12 mg/dL (ref 6–20)
Potassium: 4.2 mmol/L (ref 3.3–5.1)
Potassium: 4.4 mmol/L (ref 3.3–5.1)
Sodium: 140 mmol/L (ref 133–145)
Sodium: 139 mmol/L (ref 133–145)
eGFR BY CREAT: 110 *
eGFR BY CREAT: 118 *

## 2021-12-22 LAB — CBC
Hematocrit: 41 % (ref 37–52)
Hemoglobin: 14 g/dL (ref 12.0–17.0)
MCH: 30 pg (ref 26–32)
MCHC: 34 g/dL (ref 32–37)
MCV: 89 fL (ref 75–100)
Platelets: 303 10*3/uL (ref 150–450)
RBC: 4.7 MIL/uL (ref 4.0–6.0)
RDW: 13.7 % (ref 0.0–15.0)
WBC: 7.5 10*3/uL (ref 3.5–11.0)

## 2021-12-22 LAB — PATH REVIEW,FL

## 2021-12-22 LAB — CRP
CRP: 123 mg/L — ABNORMAL HIGH (ref 0–8)
CRP: 117 mg/L — ABNORMAL HIGH (ref 0–8)

## 2021-12-22 LAB — SEDIMENTATION RATE, AUTOMATED: Sedimentation Rate: 91 mm/hr — ABNORMAL HIGH (ref 0–15)

## 2021-12-22 MED ORDER — OXYCODONE HCL 10 MG PO TABS *I*
10.0000 mg | ORAL_TABLET | ORAL | Status: DC | PRN
Start: 2021-12-22 — End: 2021-12-24
  Administered 2021-12-22 – 2021-12-24 (×6): 10 mg via ORAL
  Filled 2021-12-22 (×7): qty 1

## 2021-12-22 MED ORDER — OXYCODONE HCL 5 MG PO TABS *I*
5.0000 mg | ORAL_TABLET | ORAL | Status: DC | PRN
Start: 2021-12-22 — End: 2021-12-24
  Administered 2021-12-22: 5 mg via ORAL
  Filled 2021-12-22: qty 1

## 2021-12-22 MED ORDER — ERTAPENEM IN NS 1 GM *I*
1000.0000 mg | INTRAVENOUS | Status: DC
Start: 2021-12-22 — End: 2021-12-28
  Administered 2021-12-22 – 2021-12-28 (×7): 1000 mg via INTRAVENOUS
  Filled 2021-12-22 (×7): qty 1

## 2021-12-22 NOTE — Progress Notes (Signed)
Occupational Therapy Treatment        Discharge Recommendations:  Prior Living Environment, Intermittent Supervision, Assist with IADLs    OT Discharge Equipment Recommended: (P) Raised toilet seat     Hospital Stay Recommendations:  Encourage active participation with ADLs, Toilet transfer status 1A with RW, and OOB for meals    OT Referral Recommendations : (P) Physical Therapy   Treatment       12/22/21 0748   Visit Details [facility])   Visit Type ([facility]) Follow up - General   Tx Prioritization 2 - High   OT Last Visit   Visit (#)  2   Precautions   Precautions used Yes   Weight Bearing Status RLE PWB  (50% WB through RLE)   Brace Applied HTWB   Fall Precautions General falls precautions   LDA Observation VAC;IV lines   Patient Wearing Mask No   Writer wearing PPE including Gloves;Mask   Activity Order Activity as tolerated   Current Pain Assessment   Pain Assessment / Reassessment Assessment   Pain Scale 0-10 (Numeric Scale for Pain Intensity)    0-10 Scale 5   Pain Location/Orientation Knee Right   (T) Pain Intervention(s) for current pain Repositioned;Cold applied   ADL Assessment   Eating Independent   Oral Care Set up   Assist Needed With: Set up of materials   Where Oral Care Assessed Seated at sink   Grooming Set up   Areas Assessed: Washing face;Washing hands   Where Grooming Assessed Sitting at sink   UE Dressing Set up   LE Dressing Minimal Assist  (min A to manage HTWB, steadying in standing to pull to waist, assist clothing over VAC)   Bathing Set up;Supervision  (estimate, seated sponge bath)   Toileting Contact guard   Where Toileting Assessed Standard toilet  (with grab bar use)   Additional Comments Pt completes ADLs with level of assist as dictated above. Pt continues to be limited by pain. Demonstrates adequate distal reaching, estimate able to thread pants/underwear over RLE, but this date requires assistance for HTWB management. Reports could have assist from spouse prior to when she leave for  work. Completes UB ADLs including oral care seated at sink   Bed Mobility   Supine to Sit Contact Guard;HOB flat   Additional Comments Pt nanages RLE over EOB without assistance. Increased time to fully position hips at EOB due to R knee pain.   Functional Transfers   Sit to Stand Contact guard;Rolling walker   Stand to Sit Contact guard;Rolling walker   Toilet Transfers Contact guard;Rolling Walker  (standard height seat with use of grab bar)   Functional Mobility Contact Guard;Rolling Walker   Additional Comments Pt completes STS from EOB, and is able to maintain 50% WB through RLE with use of RW. Immediate increase in pain once in stance in HTWB, requires seated rest break prior to further ambulation. Pt ambulates into bathroom with writer managing IV pole and VAC. Cues to push out RLE during descent onto toilet, pt utilizing grab bar. Pt has low toilet seat height at home, likely to have difficulty completing on his own due to pain response. Pt is agreeable to RTS. Pt was educated further on tub transfer bench, pt reporting he is content with sponge bathing at d/c. At end of session was left seated on toilet with PT to start their evaluation.   Balance   Sitting - Static Independent    Sitting - Dynamic Supervision   Standing -  Static Contact Guard;Supported;Unsupported   Standing - Dynamic Contact Guard;Supported   Standing Tolerance during Functional Task fair   OT Functional Outcome Measures   Functional Outcome Measures Yes   OT AM-PAC Self Care   Putting on and taking off regular lower body clothing? 3   Bathing (including washing, rinsing, drying)? 3   Toileting, which includes using toilet, bedpan, or urinal? 3   Putting on and taking off regular upper body clothing? 3   Taking care of personal grooming such as brushing teeth? 3   Eating Meals 4   Total Raw Score 19   CMS Score - Calculated 42.80%   Recommendation   OT Discharge Recommendations Prior Living Environment;Intermittent supervision;Discharge  Equipment (comment)   OT Referral Recommendations  Physical Therapy   OT Discharge Equipment Recommended Raised toilet seat   OT needs to see patient prior to DC  No          OCCUPATIONAL THERAPY PROVIDER     Electronically Signed By:   Lawerance Cruel, OT    Please contact the OT via Secure Chat to: [facility] Occupational Therapy First Call with any questions/concerns and/or update requests.    Timed Calculations:  Timed Codes:  25 ADL   Untimed Codes: 0  Unbilled Time: 0  Total Time:  25 minutes

## 2021-12-22 NOTE — Progress Notes (Signed)
Physical Therapy Initial Evaluation    Therapy Recommendations:  Discharge Recommendations:  Discharge to Planned Living Arrangement with Caregiver Assistance:     Patient's mobility is currently a barrier to discharge and further PT visits will be needed. .   Recommendations:   PT Discharge Equipment Recommended: (P) Rolling walker   Additional justification:   N/A     PT Mobility Recommendations: (P) SBA RW, amb to bathroom ad lib.  PT Referral Recommendations: (P) OT, Home care    History of Present Admission: 36 y.o. male admitted on 12/19/2021 now POD#2 s/p R tibia ROH and I&D. NVI.     History reviewed. No pertinent past medical history.     History reviewed. No pertinent surgical history.    Personal factors affecting treatment/recovery:  Needs assistance with prior ADLs and mobility    Comorbidities affecting treatment/recovery:  none noted    Clinical presentation:  stable    Patient complexity:    low level as indicated by above stability of condition, personal factors, environmental factors and comorbidities in addition to their impairments found on physical exam.       12/22/21 0810   Prior Living    Prior Living Situation Reported by patient   Lives With Family  (spouse and 3 kids (12, 8, 1))   Type of Home [place]   # Steps to Enter Home 4   # Rails to [employer] Home 1   # Of Steps In Home 0   # Rails in Home 0   Location of Bedrooms 1st floor   Location of Bathrooms 1st floor   Bathroom Accessibility Tub/Shower unit  (small bathroom)   Current Home Equipment Cane (straight);Wheelchair (manual);Grab bars in shower/tub;Grab bars around toilet  (low toilet)   Additional Comments spouse works full time during day, will be alone at home during these times upon d/c   Prior Function Level   Prior Function Level Reported by patient   Transfers Independent   Transfer Devices None   Walking Unable   Wheelchair mobility Independent   Stair negotiation Required assistance   Additional Comments patient states  since initial injury in 2021, he has not been ambulatory. patient states he uses WC for mobility within the home, transfers without assist <> wc   PT Tracking   [facility]) PT TRACKING PT Assigned   Visit Number   Visit Number [facility]) / Treatment Day (HH) 1   Visit Details [facility])   Visit Type [facility]) Evaluation   Precautions/Observations   Precautions used Yes   Weight Bearing Status RLE PWB  (50% in HTWB)   Brace Applied Yes   Brace Type Specialty shoe  (HTWB)   Fall Precautions General falls precautions   Activity Orders Present Yes   LDA Observation VAC;IV lines   Vital Signs Response with Therapy stable   Was patient wearing a mask? No   PPE worn by Clinical research associate [facility]   Patient Subjective Pt agreeable to skilled PT   Current Pain Assessment   Pain Assessment / Reassessment Assessment   Pain Scale 0-10 (Numeric Scale for Pain Intensity)    0-10 Scale 7   Pain Location/Orientation Leg Right   Pain Descriptors Sharp;Constant   Vision    Current Vision Adequate for PT session   Communication   Communication Communication Style   Communication Style Verbal   Cognition   Cognition Tested   Arousal/Alertness Appropriate responses to stimuli   Orientation A&Ox4   Ability to Follow Instructions Follows all commands  and directions without difficulty   UE Assessment   Assessment Focus   (BUE ROM and strength WFL)   LE Assessment   Assessment Focus   (LLE ROM and strength WFL, RLE limited due to pain)   Sensation   Sensation No apparent deficit   Bed Mobility   Bed mobility Not tested   Additional comments patient encountered in bathroom with OT, left in recliner at end of session   Transfers   Transfers Tested   Weight Bearing Status RLE PWB   Sit to Stand Stand by assistance   Stand to sit Stand by assistance   Transfer Assistive Device rolling walker   Additional comments stands from toilet with increased time to come to stance due to increased pain. Patient required VC for hand placement to push off from grab bar or edge of toilet  with one hand to come to stance. to sit down on recliner at end of session, pt required VC to step back until feeling the recliner on posterior aspect of legs, kicking RLE forward and reaching bak for support surface to assist with eccentric control and safety when sitting. paient left in recliner with RLE elevated, call bell in reach and all needs met.   Mobility   Mobility: Gait/Stairs Tested   Weight Bearing Status RLE PWB  (50%)   Gait Pattern 3 point;Antalgic right side;Decreased cadence;Decreased R step length;Decreased R step height   Ambulation Assist Stand by   Ambulation Distance (Feet) 10'   Ambulation Assistive Device rolling walker   Additional comments ambulates from bathroom to toilet with RW, 3 pt gt pattern with increased pain and short small steps. pt able to maintain RLE PWB well by compensating with use of BUE through walker. patient deferred further mobility training at this time due to increased pain with mobility   Training and Education   Patient pt edu on safety with all mobility, role of skilled PT, use of AD, and WB status.   Balance   Balance Tested   Sitting - Static Independent   Sitting - Dynamic Independent   Standing - Static Standby assist   Standing - Dynamic Standby assist   Functional Outcome Measures   Functional Outcome Measures Yes   PT AM-PAC Mobility   Turning over in bed? 3   Moving from lying on back to sitting on the side of the bed? 3   Moving to and from a bed to a chair? 3   Sitting down on and standing up from a chair with arms? 3   Need to walk in hospital room? 3   Climbing 3 - 5 steps with a railing? 1   Total Raw Score 16   AM-PAC T-Scale Score 38.32   Cumulative Ambulation Score (CAM)   Get in and out of bed 1   Sit to stand, stand to sit from chair 1   Walking 1   Total CAM Score (max 6) 3   Assessment   Brief Assessment Appropriate for skilled therapy   Problem List Pain contributing to impairment;Impaired functional mobility;Impaired transfers;Impaired bed  mobility;Impaired ambulation   Patient / Family Goal decrease pain   Overall Assessment patient is currently functioning below baseline level of mobility at this time due to increased pain and decreased activity tolerance. Anticipate patient to progress well towards home d/c once medically ready, with better pain control and additional PT sessions to maximize safety and independence in mobility. Will continue to progress patient as able.  Plan/Recommendation   PT Treatment Interventions No further PT interventions   PT Frequency 3-5 x/wk   PT Mobility Recommendations SBA RW, amb to bathroom ad lib.   PT Referral Recommendations OT;Home care   PT Discharge Recommendations Anticipate return to prior living arrangement;Intermittent supervision/assist;Home PT   PT Discharge Equipment Recommended Rolling walker   Transportation Recommendations any   PT Assessment/Recommendations Reviewed With: Patient;Nursing;Occupational Therapy;Advanced Practice Provider   Next PT Visit assess bed mobility and stairs as able, progress IND in transfers and ambulation   PT needs to see patient prior to DC  Yes   Time Calculation   Total Time Therapeutic Activities (minutes) 0   Total Time Gait Training (minutes) 0   Total Time Therapeutic Exercises (minutes) 0   Total Time Neuromuscular Re-education (minutes) 0   Total Time Group Therapy (minutes) 0   PT Timed Codes 0   PT Untimed Codes 12   PT Unbilled Time 0   PT Total Treatment 12   Plan and Onset date   Plan of Care Date 12/22/21   Onset Date 12/19/21   Treatment Start Date 12/22/21   Lauris Poag, PT, DPT    Please contact physical therapist on the treatment team via secure chat or via the secure chat group for your unit Physical Therapist with any questions.  On weekends, please utilize the secure chat group, [facility] Physical Therapy 1st call, to contact PT.

## 2021-12-22 NOTE — Plan of Care (Signed)
Problem: Impaired Bed Mobility  Goal: STG - IMPROVE BED MOBILITY  Note: Patient will perform bed mobility without rails and the head of bed flat with No assist (Independently)     Time frame: 3-5 Visits     Problem: Impaired Transfers  Goal: STG - IMPROVE TRANSFERS  Note: Patient will complete Sit to stand transfers using a rolling walker with Modified independence     Time frame: 3-5 Visits     Problem: Impaired Ambulation  Goal: STG - IMPROVE AMBULATION  Note: Patient will ambulate less than 50 feet using a rolling walker with Modified independence    Time frame: 3-5 Visits       Problem: Impaired Stair Navigation  Goal: STG - IMPROVE STAIR NAVIGATION  Note: Patient will navigate 4-7 steps with 1  rail(s) and least restrictive assistive device and Stand by assist of 1     Time frame: 3-5 Visits
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
## 2021-12-22 NOTE — Progress Notes (Addendum)
Orthopaedic Surgery Progress Note  Patient:[name]  MRN: [id number]  DOA: 12/19/2021  Ortho Progress Note for 12/22/2021    Subjective: NAEON. VSS. Afebrile. Reporting night sweats.  Pain controlled. Denies numbness/tingling of the extremities. Denies n/v/CP/SOB. CT .Reporting night sweats. WBC. 8.5. CRP 123. Seen by ID yesterday.     Cxs:   Proximal abscess w/ enterobacter  RLE abscess w/ gram negative bacilli   Intra-op cxs w/ NGTD     Objective:  Temp:  [35.5 C (95.9 F)-37.1 C (98.8 F)] 36.6 C (97.9 F)  Heart Rate:  [68-85] 75  Resp:  [16-18] 16  BP: (112-153)/(64-86) 112/64  Patient Vitals for the past 24 hrs:   BP Temp Temp src Pulse Resp SpO2   12/22/21 0305 112/64 36.6 C (97.9 F) TEMPORAL 75 16 96 %   12/21/21 2210 112/64 37.1 C (98.8 F) TEMPORAL 80 16 95 %   12/21/21 1908 113/69 35.9 C (96.6 F) TEMPORAL 85 16 97 %   12/21/21 1553 119/73 35.5 C (95.9 F) TEMPORAL 68 16 98 %   12/21/21 1053 153/86 35.8 C (96.4 F) TEMPORAL 85 16 98 %   12/21/21 0724 -- -- -- -- 18 --     Recent Labs   Lab 12/21/21  2240 12/20/21  2236 12/19/21  0846   WBC 8.5 11.3* 7.9   Hemoglobin 13.4 13.9 14.3   Hematocrit 39 41 43   Platelets 294 302 296     Recent Labs   Lab 12/21/21  2240 12/20/21  2236 12/19/21  0846   Sodium 140 138 140   Potassium 4.2 4.5 4.8   Chloride 103 103 106   CO2 25 23 21      No components found with this basename: BUN, LABGLOM, CALCIUM    Recent Labs   Lab 12/19/21  0846   INR 1.0     Recent Labs   Lab 12/21/21  2240 12/20/21  2236 12/19/21  0846   Sedimentation Rate 90* 76* 64*   CRP 123* 92* 70*     Date 12/21/21 0700 - 12/22/21 0659 12/22/21 0700 - 12/23/21 0659   Shift 0700-0659 24 Hour Total 0700-0659 24 Hour Total   INTAKE   P.O. 840 840       P.O. 840 840     Shift Total(mL/kg) 840(6.6) 840(6.6)     OUTPUT   Urine(mL/kg/hr) 1650 1650       Urine 1650 1650     Drains 0 0       Output (ml) (Negative Pressure Wound Therapy Leg Anterior;Distal;Lower;Right) 0 0     Shift Total(mL/kg) 1650(13)  1650(13)     NET -810 -810     Weight (kg) 127 127 127 127       Current Medications:   piperacillin-tazobactam  4.5 g Intravenous Q8H    docusate sodium  200 mg Oral Nightly    senna  2 tablet Oral Daily    acetaminophen  1,000 mg Oral Q8H    polyethylene glycol  17 g Oral Daily    enoxaparin  40 mg Subcutaneous Q24H        oxyCODONE **OR** oxyCODONE, sodium chloride, dextrose, bisacodyl, sodium chloride, dextrose    Exam:  NAD  No respiratory distress    RLE: Dressing c/d/I, prevena in place and holding suction. Dressing changed today and prevena replaced.         SILT sp/dp/saph/sur/tib nerve distributions  SILT over tips of toes  +ADF/APF/EHL/Toe f/e  Brisk CR,  palpable DP/PT    Imaging:   CT R tib/fib: Healing fracture, post surgical changes w/ some none specific fluid pockets and subcutaneous emphysema     Assessment and Plan  36 y.o. male admitted on 12/19/2021 now POD#2 s/p R tibia ROH and I&D. NVI.    Antibiotic regimen: Zosyn, plan for full ID consult in AM.   Pain Management: multimodal   DVT prophylaxis: Lovenox   Weight bearing status and activity restrictions: protected WB RLE until attending discussion  Wound Care: Dressing change complete, next dressing change 11/10   Postoperative x-rays and radiographic studies: R tibia films, CT R tibia complete  Diet: regular  PT/OT/OOB  Dispo: pending pain/PT/abx plan     Zerita Boers, MD   Orthopaedic Surgery Resident  12/22/2021  6:52 AM       I saw and evaluated the patient. I agree with the resident's/fellow's findings and plan of care as documented.    Jerolyn Center, MD

## 2021-12-23 ENCOUNTER — Inpatient Hospital Stay: Payer: MEDICAID

## 2021-12-23 LAB — VENOUS BLOOD GAS
Base Excess,VENOUS: 3 mmol/L — ABNORMAL HIGH (ref <=2)
Bicarbonate,VENOUS: 27 mmol/L (ref 21–28)
CO2 (Calc),VENOUS: 28 mmol/L (ref 22–31)
CO: 1.1 %
FO2 HB,VENOUS: 72 % (ref 63–83)
Hemoglobin: 13.9 g/dL (ref 12.0–17.0)
Methemoglobin: 1.4 % — ABNORMAL HIGH (ref 0.0–1.0)
PCO2,VENOUS: 41 mm Hg (ref 40–50)
PH,VENOUS: 7.43 — ABNORMAL HIGH (ref 7.32–7.42)
PO2,VENOUS: 40 mm Hg (ref 25–43)

## 2021-12-23 LAB — BASIC METABOLIC PANEL
Anion Gap: 12 (ref 7–16)
CO2: 24 mmol/L (ref 20–28)
Calcium: 9.3 mg/dL (ref 9.0–10.3)
Chloride: 104 mmol/L (ref 96–108)
Creatinine: 0.8 mg/dL (ref 0.67–1.17)
Glucose: 107 mg/dL — ABNORMAL HIGH (ref 60–99)
Lab: 14 mg/dL (ref 6–20)
Potassium: 4.3 mmol/L (ref 3.3–5.1)
Sodium: 140 mmol/L (ref 133–145)
eGFR BY CREAT: 118 *

## 2021-12-23 LAB — AEROBIC CULTURE

## 2021-12-23 LAB — SEDIMENTATION RATE, AUTOMATED: Sedimentation Rate: 78 mm/hr — ABNORMAL HIGH (ref 0–15)

## 2021-12-23 LAB — CRP: CRP: 82 mg/L — ABNORMAL HIGH (ref 0–8)

## 2021-12-23 MED ORDER — SODIUM CHLORIDE 0.9 % INJ (FLUSH) WRAPPED *I*
10.0000 mL | Status: DC | PRN
Start: 2021-12-23 — End: 2021-12-28

## 2021-12-23 MED ORDER — SODIUM CHLORIDE 0.9 % INJ (FLUSH) WRAPPED *I*
10.0000 mL | Freq: Three times a day (TID) | Status: DC | PRN
Start: 2021-12-23 — End: 2021-12-28

## 2021-12-23 MED ORDER — LIDOCAINE HCL 1% IJ SOLN *WRAPPED* (FOR LINE PLACEMENT ONLY)
1.0000 mL | Freq: Once | INTRAMUSCULAR | Status: AC | PRN
Start: 2021-12-23 — End: 2021-12-23
  Administered 2021-12-23: 1 mL via INTRADERMAL

## 2021-12-23 NOTE — Continuity of Care (Addendum)
Per rounds this AM, patient will need home IV abx therapy. Referral made to [facility].     Prescriptions needed for Home IV therapy:    1. Drug to include dose, duration, frequency and refills (need as soon MD aware of what medication patient will be going home on )  2. SNS 10 cc pre-filled syringes ( flush before/after each dose of IV antibiotics) with refills  3. Heparin flush as needed per line protocol (standard adult dose is Heparin 10 units/ml 5cc flush)  4. Labs ordered while on IV antibiotics (VNS requires minimum of weekly SMA-8, CBC with diff, trough levels as appropriate, CRP and sed rates for treatment of bone infections, CK weekly for daptomycin patients)  5. PICC line dressing kits (usually order 5) with refills  6. Completion of Fiscal Order if Medicaid is the payor (vendor will FAX to [facility] for MD to complete)    Prescription is not required for infusion pump or empty syringes for blood draws    Signature of MD, PA or NP and NPI number (must be [place] enrolled for Medicare and [place] enrolled for Medicaid)    Email sent to [facility] to inquire if they accept Medicaid Pending, awaiting response.     Will send [facility] an in basket message requesting NPV ASAP as patient will need home care services.    HOME CARE AGENCY CHOICE    Medicare regulations require that hospitals advise patients that they may choose to receive services from any home health agency.  Patient/family choice of agency is noted with an [x]  by that agency's name.      Agencies: [x]   [facility] ([address] [place], [place], [place], [place], [place] and [place])       []   [facility] [place]       []   [facility]      []   [facility]        []   [facility] Only)      []   [facility] Only)        []   Other ____________________      Home Care Agency Representative alerted:    Name: Felipa Eth, RN [facility]    Date: 12/23/21    Time: 11:04 AM      *[facility]  along with [facility], [facility] and [facility], [facility], and [facility] are all part of the [place] [facility] [place] health care system.      [facility] will continue to follow patient's hospital course until discharge plans are finalized.     Lazarus Salines, RN   Acute Care Coordinator  Cell: [phone]  Weekend covering Coordinator: [phone]

## 2021-12-23 NOTE — Progress Notes (Signed)
Home Health Assessment     Completed by Floy Sabina RN [facility]   Phone: Cell-[phone]    Referred by:  Lazarus Salines RN [facility]       Source of Information: Medical record       Home Health indicators present: Pt will need follow up at home- Pt will NEED to attend his New PCP follow up appt for [facility] to open pt to services       Barriers to discharge to be addressed:  Will assess       Question Patient?   No     Plan:  Writer reviewed chart. [facility] will continue to follow hospital course and arrange appropriate services at time of discharge. Thank you.      Floy Sabina RN [facility] [phone]  [facility] Coordinator  After hours and Holidays [phone]

## 2021-12-23 NOTE — Procedures (Signed)
PICC ASSESSMENT/PROCEDURE NOTE    Pre-Procedure Assessment/Information-    Carin Hock, RN received Handoff Report from Carlisle Cater, RN  for PICC procedure [phone].      Patient Vascular History/Contraindications: No Known Problems  Nephrology note required for PICC?: No  Labs Reviewed:      Lab results: 12/22/21  2214   eGFR BY CREAT 118     No results found for: CREATININE      Lab results: 12/22/21  2214   WBC 7.5           Lab results: 12/19/21  0846   INR 1.0           Lab results: 12/22/21  2214   Platelets 303       Extremity Assessment:  WNL  Indications:  Long Term Abx  Education Provided to Patient /Family: yes    Procedure Information-  Time out documentation completed prior to procedure: Yes  Lot #: [id number]                                       Size:4 French Single Lumen PICC  Side:right    Procedure Date :12/23/2021  Ultrasound used:yes  Modified Seldinger technique used.  Catheter exchange: no  Vein Accessed: cephalic  Venipuncture attempts & sites: 1   Guide Wire advancement: easy  Catheter Advancement: easy  Switched arms: no  Procedure: successful  Guide Wire Removed : yes    Placement Details:   Total length of trimmed line: 46 cm  External catheter length: 0 cm  PICC Securement: Adhesive  A  For further details please see Doc Flowsheets IV assessment.    Complications: None    Condition:    Pre-Procedure PICC Site Pain Assessment: Scale used: Numeric Score: 0 Time: 11:38AM   Post-Procedure PICC Site Pain Assessment: Scale used: NumericScore: 0 Time 12:26AM  Comfort Measures Provided : Pain Medication as Ordered by Provider     Placement Verification:  PICC tip verified by ECG Tip Confirmation System: Yes  ECG rhythm strip placed in chart and uploaded to patient MEDICAL RECORD NUMBERYes  VBG WNL: Yes  STAT Portable Chest X-Ray Ordered :N/A    EBL: Minimal    Disposition: Inpatient Unit    Handoff Info:    Patient Patient tolerated procedure well   Specimen Collection: yes  Procedure Dressing Site  located:Right Arm  Dressing Type:occlusive  Biopatch:Yes  Dressing status:Clean, dry and intact   Hematoma:Not evident  Medication received:Lidocaine 1%  Implant patient information given to patient or parent/guardian:Yes  Report given to:Unit Nurse. Unit [id number] Name Carlisle Cater, RN .  Floor Nurse is aware that OK to Use PICC order must be placed prior to use of PICC.  PICC insertion performed by Augustin Schooling, RN  12/23/2021  11:38 AM

## 2021-12-23 NOTE — Progress Notes (Signed)
REFERRAL INFORMATION NOTE: Plan is for pt to have Anise Salvo PCP appt Wed 12/29/21 and if pt goes to appt then [facility] will see pt on Thurs 12/30/21.  [facility] Lazarus Salines RN aware    Home visit address/ contact phone confirmed: Pt will be staying at [address], [place] [address].    Has the patient had any falls (with or without injury) in the last 3 months? Unk  Has the patient had any near misses (where they would have fallen, but caught self) in the last 3 months? Unk    Designated caregiver (name and phone #):  Vanessa Barbara [facility])   [phone] (H)  Donavan Foil (SIG) [phone] (H)    Current DME or supplies in home: Pecola Leisure W/c    The following services have been arranged with [facility] [phone]:  __X__Nursing   eval for Physical Therapy         The agency will call to schedule first home visit date for (CHN,PT etc) services within 24-48 hours of facility discharge, and pt/family are in agreement.    Special instructions for scheduling first visit:  Please call with visit times    The following medical equipment and/or supplies have been arranged by [facility]  __X__infusion supplies  __X___ RW and RTS thru [facility]        Focus of care and identified patient/caregiver teaching needs at home: Pt w/ RLE abscess s/p R tibia ROH and I&D 12/20/21, S/p RUE SL PICC 12/23/21    Discharge Antibiotics and Dose: Ertapenem 1g q24h  Antibiotic Start Date: 12/20/21  Anticipated Antibiotic Stop Date: 01/31/22    CHN to draw weekly and PRN Labs via PICC (please draw on Mondays or Tuesdays beginning week of 01/03/22 (preferably sent to a [facility] Lab):  CBC with differential  CMP  CRP every 2 weeks  Fax/route labs to Outpatient ID attending and PCP: Dr Jeannie Done ID AT  PCP Dr Raiford Noble    enoxaparin 40 mg/0.48mL injection  Commonly known as: LOVENOX  Inject 1 syringe (40 mg total) into the skin every 12 hours for 30 days    Activity:  Weight bearing as tolerated right lower extremity.    Wiggle toes of affected limb.  When you are not walking, keep  leg elevated as much as possible.  Apply ice to affected extremity as needed.  Do not drive until instructed that you may do so by your physician.    PrevenaT Incision Management System- Your PREVENAT System uses negative pressure - like a vacuum - to help protect and heal your incision at the skin surface as well as below.  -Do not turn therapy unit off unless advised by the treating physician or there is bleeding, signs of infection or signs of an allergic response  -Caution: Dressing should only be removed by or on the advice of the treating physician.  -If an alert sounds, an action is required on your part; refer to "Indicators and Alerts".   -After 7 Days the pump will no longer function, and vacuum/negative pressure will no longer be applied. At this time you can cut the tubing as close to your dressing as possible. Call your surgeons office for further instructions on dressing care. They may transition you to a different type of dressing or ask that it remain in place until your follow up appointment.   -Please bring your pump with its canister to your follow up appointment where any output/drainage  can be evaluated and the pump disposed of.    Charging and duration of treatment-  -The Prevena pump will only last 7 days. The circular array of lights on the front of the pump indicate the number of days remaining in therapy.   -The Prevena pump has battery capacity but it does need to be charged. We recommending charging the pump while at rest and near an outlet, and overnight.    Bathing  .Bathing in tub is not recommended.  .Do not submerge therapy unit or dressing  .Light showering is permissible. Protect therapy unit from direct spray. Avoid prolonged water contact with therapy unit and dressing.  .When towel drying avoid disrupting or damaging the dressing.    Cleaning  The therapy unit and carrying case can be wiped with a damp cloth  using a mild household soap solution that does not contain bleach.    Sleeping  .Place therapy unit in a position where tubing will not become kinked or pinched.  .Ensure therapy unit cannot be pulled off table, or dropped on the floor during sleep.    Reinforce Alarms/Alerts from Prevena      Confirmed Attending MD:  Raiford Noble to sign all orders. Per Nurse Baxter Hire send also wound care orders to Dr Al Corpus  MD Address:  [address]  [place] [address]  MD Phone:   [phone]  Confirmed by Felipa Eth RN [facility] with  Butch Penny NP in MD office 12/29/21    Ancillary ON:GEXB, Jonny Ruiz   Dr Gillermina Phy  will sign home care orders related to Ortho surgery  MD Address: [address]  [place]  [place] [address]  MD Phone:[phone]    Ancillary DG:UYQI Pennelope Bracken  ID AT will sign orders related to PICC, Labs, Abx  MD address:  [address]  [place] [address]  MD Phone  [phone]      Insurance Information:  Patient/family informed of home care/ DME copays  __X__not applicable   ____Yes    Patient has been screened for Medicare as a Secondary Payer (MSP)  ____eligible for [facility]     __X__not eligible    MVA or WC case:  No    The above arrangements are based on an in-hospital evaluation. It is short-term and will be re-evaluated by the Home Health Care Nurse in the home on a regular basis, as per physician's order.     Floy Sabina RN [phone]  [facility]   After hours and on weekends/Holidays [phone]

## 2021-12-23 NOTE — Progress Notes (Signed)
Physical Therapy Treatment Note    Therapy Recommendations:  Discharge Recommendations:  Discharge to Planned Living Arrangement with Caregiver Assistance:     Patient's mobility is currently a barrier to discharge and further PT visits will be needed. .   Recommendations:   PT Discharge Equipment Recommended: (P) Rolling walker   Additional justification:   N/A     PT Mobility Recommendations: (P) Ambulate with 1 assist and RW  PT Referral Recommendations: (P) OT, Home care           12/23/21 0832   PT Tracking   [facility]) PT TRACKING PT Assigned   Visit Number   Visit Number [facility]) / Treatment Day (HH) 2   Visit Details [facility])   Visit Type [facility]) Follow Up-General   Precautions/Observations   Precautions used Yes   Weight Bearing Status RLE PWB   Brace Applied Yes   Brace Type Specialty shoe  (HTWB R LE)   Fall Precautions General falls precautions   Activity Orders Present Yes   LDA Observation VAC   Vital Signs Response with Therapy Stable   Was patient wearing a mask? No   PPE worn by Clinical research associate [facility]   Patient Subjective Pt was motivated to go to cafeteria for food.   Current Pain Assessment   Pain Assessment / Reassessment Assessment   Pain Scale 0-10 (Numeric Scale for Pain Intensity)    0-10 Scale 6   Pain Location/Orientation Knee Right   Pain Descriptors Aching   (T) Pain Intervention(s) for current pain Repositioned   Cognition   Additional Comments Pt was alert, and following all commands   Bed Mobility   Bed mobility Not tested   Additional comments Pt was sitting up at EOB at start of session.   Transfers   Transfers Tested   Weight Bearing Status RLE PWB   Sit to Stand Stand by assistance   Stand to sit Stand by assistance   Transfer Assistive Device rolling walker   Additional comments Pt was able to stand from EOB wtih no phyiscal assist from writer, and had good balance once up.  Pt had good understanding, and agreeable to ambulate   Mobility   Mobility: Gait/Stairs Tested   Weight Bearing Status  RLE PWB   Gait Pattern Antalgic right side   Ambulation Assist Stand by   Ambulation Distance (Feet) 150   Ambulation Assistive Device rolling walker   Additional comments Pt had slow, but steady overall gait speed.  Pt needed cues to stand proper distance from RW, however had good ability to maintain PWB.  Pt had no overt LOB, or c/o dizziness, and was left up in wheelchair to go get food in cafeteria.   Balance   Balance Tested   Sitting - Static Independent   Sitting - Dynamic Independent   Standing - Static Standby assist   Standing - Dynamic Standby assist   PT AM-PAC Mobility   Turning over in bed? 3   Moving from lying on back to sitting on the side of the bed? 3   Moving to and from a bed to a chair? 3   Sitting down on and standing up from a chair with arms? 3   Need to walk in hospital room? 3   Climbing 3 - 5 steps with a railing? 1   Total Raw Score 16   AM-PAC T-Scale Score 38.32   Cumulative Ambulation Score (CAM)   Get in and out of bed 1   Sit to  stand, stand to sit from chair 1   Walking 1   Total CAM Score (max 6) 3   Assessment   Brief Assessment Appropriate for skilled therapy   Patient / Family Goal return home   Overall Assessment Pt tolerated session well, however is mainly limited by increased pain, and decreased endurance.  Will progress as able, however will likely clear to d/c home with 1-2 more sessions   Plan/Recommendation   PT Treatment Interventions Gait training;Transfers training;Stair training   PT Frequency 3-5 x/wk   PT Mobility Recommendations Ambulate with 1 assist and RW   PT Referral Recommendations OT;Home care   PT Discharge Recommendations Anticipate return to prior living arrangement;Intermittent supervision/assist   PT Discharge Equipment Recommended Rolling walker   Transportation Recommendations any   PT Assessment/Recommendations Reviewed With: Patient;Nursing   Next PT Visit Progress ambulation, and attempt stairs   PT needs to see patient prior to DC  Yes   Time  Calculation   Total Time Therapeutic Activities (minutes) 11   Total Time Gait Training (minutes) 12   Total Time Therapeutic Exercises (minutes) 0   Total Time Neuromuscular Re-education (minutes) 0   Total Time Group Therapy (minutes) 0   PT Timed Codes 23   PT Untimed Codes 0   PT Unbilled Time 0   PT Total Treatment 23   Plan and Onset date   Plan of Care Date 12/22/21   Onset Date 12/19/21   Treatment Start Date 12/22/21   Arloa Koh, PT, DPT  [phone]    Please contact physical therapist on the treatment team via secure chat or via the secure chat group for your unit Physical Therapist with any questions.  On weekends, please utilize the secure chat group, [facility] Physical Therapy 1st call, to contact PT.

## 2021-12-23 NOTE — Progress Notes (Addendum)
Orthopaedic Surgery Progress Note  Patient:[name]  MRN: [id number]  DOA: 12/19/2021  Ortho Progress Note for 12/23/2021    Subjective: NAEON. VSS. Afebrile.  Pain controlled. Denies numbness/tingling of the extremities. Denies n/v/CP/SOB. WBC. 7.5 (8.5). CRP 117 (123). ESR 91.     Cxs:   Proximal abscess w/ enterobacter  RLE abscess w/ enterobacter.   Intra-op cxs w/ NGTD     Objective:  Temp:  [36.1 C (97 F)-36.6 C (97.8 F)] 36.6 C (97.8 F)  Heart Rate:  [74-78] 76  Resp:  [16] 16  BP: (112-122)/(65-74) 112/65  Patient Vitals for the past 24 hrs:   BP Temp Temp src Pulse Resp SpO2   12/22/21 2301 112/65 36.6 C (97.8 F) TEMPORAL 76 16 96 %   12/22/21 1511 117/70 36.2 C (97.2 F) TEMPORAL 78 16 95 %   12/22/21 1004 122/70 36.1 C (97 F) TEMPORAL 78 16 95 %   12/22/21 0716 121/74 36.5 C (97.7 F) TEMPORAL 74 16 95 %       Recent Labs   Lab 12/22/21  2214 12/21/21  2240 12/20/21  2236   WBC 7.5 8.5 11.3*   Hemoglobin 14.0 13.4 13.9   Hematocrit 41 39 41   Platelets 303 294 302       Recent Labs   Lab 12/22/21  2214 12/21/21  2240 12/20/21  2236   Sodium 139 140 138   Potassium 4.4 4.2 4.5   Chloride 104 103 103   CO2 23 25 23      No components found with this basename: BUN, LABGLOM, CALCIUM    Recent Labs   Lab 12/19/21  0846   INR 1.0       Recent Labs   Lab 12/22/21  2214 12/21/21  2240 12/20/21  2236   Sedimentation Rate 91* 90* 76*   CRP 117* 123* 92*       Date 12/22/21 0700 - 12/23/21 0659 12/23/21 0700 - 12/24/21 0659   Shift 0700-0659 24 Hour Total 0700-0659 24 Hour Total   INTAKE   P.O. 240 240       P.O. 240 240     Shift Total(mL/kg) 240(1.9) 240(1.9)     OUTPUT   Urine(mL/kg/hr) 900 900       Urine 900 900     Drains 0 0       Output (ml) (Negative Pressure Wound Therapy Leg Anterior;Distal;Lower;Right) 0 0     Stool         Stool Occurrence 1 x 1 x     Shift Total(mL/kg) 900(7.1) 900(7.1)     NET -660 -660     Weight (kg) 127 127 127 127         Current Medications:   ertapenem  1,000 mg  Intravenous Q24H    docusate sodium  200 mg Oral Nightly    senna  2 tablet Oral Daily    acetaminophen  1,000 mg Oral Q8H    polyethylene glycol  17 g Oral Daily    enoxaparin  40 mg Subcutaneous Q24H        oxyCODONE **OR** oxyCODONE, sodium chloride, dextrose, bisacodyl, sodium chloride, dextrose    Exam:  NAD  No respiratory distress    RLE: Dressing c/d/I, prevena in place and holding suction. Dressing changed today and prevena replaced.     SILT sp/dp/saph/sur/tib nerve distributions  SILT over tips of toes  +ADF/APF/EHL/Toe f/e  Brisk CR, palpable DP/PT    Imaging:  CT R tib/fib: Healing fracture, post surgical changes w/ some none specific fluid pockets and subcutaneous emphysema     Assessment and Plan  36 y.o. male admitted on 12/19/2021 now POD#3 s/p R tibia ROH and I&D. NVI.    Antibiotic regimen: Zosyn stopped. Started ertapenem, anticipate 6 week course. Patient needs PICC line.   Pain Management: multimodal   DVT prophylaxis: Lovenox   Weight bearing status and activity restrictions: protected WBAT RLE  Wound Care: Dressing change complete, next dressing change 11/10   Postoperative x-rays and radiographic studies: R tibia films, CT R tibia complete  Diet: regular  PT/OT/OOB  Dispo: pending pain/PT/abx plan     Zerita Boers, MD   Orthopaedic Surgery Resident  12/23/2021  5:57 AM     I saw and evaluated the patient on 12/23/2021. I agree with the resident's/fellow's findings and plan of care as documented.    Jerolyn Center, MD

## 2021-12-24 LAB — BASIC METABOLIC PANEL
Anion Gap: 13 (ref 7–16)
CO2: 24 mmol/L (ref 20–28)
Calcium: 9.2 mg/dL (ref 9.0–10.3)
Chloride: 104 mmol/L (ref 96–108)
Creatinine: 0.72 mg/dL (ref 0.67–1.17)
Glucose: 116 mg/dL — ABNORMAL HIGH (ref 60–99)
Lab: 17 mg/dL (ref 6–20)
Potassium: 4.3 mmol/L (ref 3.3–5.1)
Sodium: 141 mmol/L (ref 133–145)
eGFR BY CREAT: 122 *

## 2021-12-24 LAB — CBC AND DIFFERENTIAL
Baso # K/uL: 0.1 10*3/uL (ref 0.0–0.2)
Baso # K/uL: 0.1 10*3/uL (ref 0.0–0.2)
Basophil %: 1.7 %
Basophil %: 0.9 %
Eos # K/uL: 0.2 10*3/uL (ref 0.0–0.5)
Eos # K/uL: 0.2 10*3/uL (ref 0.0–0.5)
Eosinophil %: 2.6 %
Eosinophil %: 2.6 %
Hematocrit: 38 % (ref 37–52)
Hematocrit: 38 % (ref 37–52)
Hemoglobin: 13 g/dL (ref 12.0–17.0)
Hemoglobin: 13.1 g/dL (ref 12.0–17.0)
Lymph # K/uL: 2.8 10*3/uL (ref 1.0–5.0)
Lymph # K/uL: 2.8 10*3/uL (ref 1.0–5.0)
Lymphocyte %: 31.3 %
Lymphocyte %: 31.3 %
MCH: 31 pg (ref 26–32)
MCH: 30 pg (ref 26–32)
MCHC: 34 g/dL (ref 32–37)
MCHC: 34 g/dL (ref 32–37)
MCV: 89 fL (ref 75–100)
MCV: 89 fL (ref 75–100)
Mono # K/uL: 0.1 10*3/uL (ref 0.1–1.0)
Mono # K/uL: 0.1 10*3/uL (ref 0.1–1.0)
Monocyte %: 0.9 %
Monocyte %: 0.9 %
Neut # K/uL: 4.6 10*3/uL (ref 1.5–6.5)
Neut # K/uL: 5.4 10*3/uL (ref 1.5–6.5)
Nucl RBC # K/uL: 0 10*3/uL (ref 0.0–0.0)
Nucl RBC # K/uL: 0 10*3/uL (ref 0.0–0.0)
Nucl RBC %: 0 /100 WBC (ref 0.0–0.2)
Nucl RBC %: 0 /100 WBC (ref 0.0–0.2)
Platelets: 337 10*3/uL (ref 150–450)
Platelets: 336 10*3/uL (ref 150–450)
RBC: 4.2 MIL/uL (ref 4.0–6.0)
RBC: 4.3 MIL/uL (ref 4.0–6.0)
RDW: 13.7 % (ref 0.0–15.0)
RDW: 13.5 % (ref 0.0–15.0)
Seg Neut %: 58.3 %
Seg Neut %: 62.6 %
WBC: 7.9 10*3/uL (ref 3.5–11.0)
WBC: 8.5 10*3/uL (ref 3.5–11.0)

## 2021-12-24 LAB — DIFF MANUAL
Diff Based On: 115 CELLS
Diff Based On: 115 CELLS
React Lymph %: 5 % (ref 0–6)
React Lymph %: 2 % (ref 0–6)

## 2021-12-24 LAB — ANAEROBIC CULTURE
Anaerobic Culture: 0
Anaerobic Culture: 0
Anaerobic Culture: 0
Anaerobic Culture: 0
Anaerobic Culture: 0
Anaerobic Culture: 0

## 2021-12-24 LAB — SEDIMENTATION RATE, AUTOMATED: Sedimentation Rate: 87 mm/hr — ABNORMAL HIGH (ref 0–15)

## 2021-12-24 LAB — AEROBIC CULTURE: Aerobic Culture: 0

## 2021-12-24 LAB — CRP: CRP: 69 mg/L — ABNORMAL HIGH (ref 0–8)

## 2021-12-24 MED ORDER — OXYCODONE HCL 10 MG PO TABS *I*
10.0000 mg | ORAL_TABLET | ORAL | Status: DC | PRN
Start: 2021-12-24 — End: 2021-12-28
  Administered 2021-12-24 – 2021-12-28 (×13): 10 mg via ORAL
  Filled 2021-12-24 (×14): qty 1

## 2021-12-24 MED ORDER — OXYCODONE HCL 5 MG PO TABS *I*
5.0000 mg | ORAL_TABLET | ORAL | Status: DC | PRN
Start: 2021-12-24 — End: 2021-12-28

## 2021-12-24 NOTE — Plan of Care (Signed)
Problem: Safety  Goal: Patient will remain free of falls  Outcome: Maintaining  Goal: Prevent any intentional injury  Outcome: Maintaining     Problem: Pain/Comfort  Goal: Patient's pain or discomfort is manageable  Outcome: Maintaining     Problem: Nutrition  Goal: Patient's nutritional status is maintained or improved  Outcome: Maintaining     Problem: Mobility  Goal: Patient's functional status is maintained or improved  Outcome: Maintaining     Problem: Psychosocial  Goal: Demonstrates ability to cope with illness  Outcome: Maintaining     Problem: Cognitive function  Goal: Cognitive function will be maintained or return to baseline  Description: Interventions:  Delirium Assessment  LIVEBAR Assessment    Outcome: Maintaining  Goal: Lines and tethers will be removed as appropriate  Outcome: Maintaining  Goal: Patient maintains appropriate nutritional intake  Outcome: Maintaining  Goal: Vital signs will be within normal limits  Outcome: Maintaining  Goal: Evidence for potential causes of delirium will be managed  Outcome: Maintaining  Goal: Behaviors will return to baseline  Outcome: Maintaining  Goal: Ambulation and mobility will be maintained  Outcome: Maintaining  Goal: Retention of urine and constipation will be managed  Outcome: Maintaining     Problem: Risk for Impaired Sleep/Wake Cycle  Goal: The patient will maintain an adequate sleep/wake cycle  Outcome: Maintaining     Problem: Post-Operative Hemodynamic Stability  Goal: Maintain Hemodynamic Stability  Outcome: Maintaining     Problem: Post-Operative Complications  Goal: Prevent post-operative complications  Outcome: Maintaining  Goal: Patient will remain free from symptoms of infection-post op  Outcome: Maintaining     Problem: Post-Operative Bowel Elimination  Goal: Elimination pattern is normal or improving  Outcome: Maintaining     Problem: Post-Operative Bladder Elimination  Goal: Patient is able to empty bladder or return to baseline  Outcome:  Maintaining
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
## 2021-12-24 NOTE — Progress Notes (Signed)
Brief Ortho Progress Note    Patient seen at bedside for prevena wound vac change and dressing change per plan. Dressings and wound vac taken down. All incisions were well-appearing with no surrounding erythema, drainage. New prevena wound vac applied to medial distal leg incisions. Xeroform and soft gauze applied to incisions about the right knee.     Patient should be seen in clinic in 7-10 days with Ainsley Spinner for prevena take down and possible suture removal.                 Katy Apo, MD  Orthopaedic Surgery  12/24/2021, 8:51 AM

## 2021-12-24 NOTE — Progress Notes (Addendum)
Orthopaedic Surgery Progress Note  Patient:[name]  MRN: [id number]  DOA: 12/19/2021  Ortho Progress Note for 12/24/2021    Subjective: NAEON. VSS. Afebrile.  Pain controlled. Denies numbness/tingling of the extremities. PICC placed yesterday.  Working with PT, more  visits are needed. Denies n/v/CP/SOB. WBC. 7.9 CRP 82.Marland Kitchen ESR 78.     Cxs:   Enterobacter       Objective:  Temp:  [36.4 C (97.5 F)-36.8 C (98.2 F)] 36.8 C (98.2 F)  Heart Rate:  [68-82] 82  Resp:  [16] 16  BP: (110-128)/(74-84) 110/74  Patient Vitals for the past 24 hrs:   BP Temp Temp src Pulse Resp SpO2   12/23/21 2100 110/74 36.8 C (98.2 F) TEMPORAL 82 16 94 %   12/23/21 0725 128/84 36.4 C (97.5 F) TEMPORAL 68 16 97 %       Recent Labs   Lab 12/23/21  2155 12/23/21  1218 12/22/21  2214 12/21/21  2240   WBC 7.9  --  7.5 8.5   Hemoglobin 13.0 13.9 14.0 13.4   Hematocrit 38  --  41 39   Platelets 337  --  303 294       Recent Labs   Lab 12/23/21  2155 12/22/21  2214 12/21/21  2240   Sodium 140 139 140   Potassium 4.3 4.4 4.2   Chloride 104 104 103   CO2 24 23 25      No components found with this basename: BUN, LABGLOM, CALCIUM    Recent Labs   Lab 12/19/21  0846   INR 1.0       Recent Labs   Lab 12/23/21  2155 12/22/21  2214 12/21/21  2240   Sedimentation Rate 78* 91* 90*   CRP 82* 117* 123*       Date 12/23/21 0700 - 12/24/21 0659 12/24/21 0700 - 12/25/21 0659   Shift 0700-0659 24 Hour Total 0700-0659 24 Hour Total   INTAKE   P.O. 120 120       P.O. 120 120     Shift Total(mL/kg) 120(0.9) 120(0.9)     OUTPUT   Urine(mL/kg/hr) 1075 1075       Urine 1075 1075     Shift Total(mL/kg) 1075(8.5) 1075(8.5)     NET -955 -955     Weight (kg) 127 127 127 127         Current Medications:   ertapenem  1,000 mg Intravenous Q24H    docusate sodium  200 mg Oral Nightly    senna  2 tablet Oral Daily    acetaminophen  1,000 mg Oral Q8H    polyethylene glycol  17 g Oral Daily    enoxaparin  40 mg Subcutaneous Q24H        sodium chloride, sodium chloride,  oxyCODONE **OR** oxyCODONE, sodium chloride, dextrose, bisacodyl, sodium chloride, dextrose    Exam:  NAD  No respiratory distress    RLE: Dressing c/d/I, prevena in place and holding suction.     SILT sp/dp/saph/sur/tib nerve distributions  SILT over tips of toes  +ADF/APF/EHL/Toe f/e  Brisk CR, palpable DP/PT    Imaging:   CT R tib/fib: Healing fracture, post surgical changes w/ some none specific fluid pockets and subcutaneous emphysema     Assessment and Plan  36 y.o. male admitted on 12/19/2021 now POD#4 s/p R tibia ROH and I&D. NVI.    Antibiotic regimen: Ertapenem, anticipate 6 week course. PICC placed.   Pain Management: multimodal   DVT prophylaxis:  Lovenox   Weight bearing status and activity restrictions: protected WBAT RLE  Wound Care: Dressing change complete, next dressing change 11/11 or 11/12   Postoperative x-rays and radiographic studies: R tibia films, CT R tibia complete  Diet: regular  PT/OT/OOB  Dispo: pending PT/final cxs sensitivities.      Zerita Boers, MD   Orthopaedic Surgery Resident  12/24/2021  5:49 AM     I saw and evaluated the patient. I agree with the resident's/fellow's findings and plan of care as documented.    Jerolyn Center, MD

## 2021-12-24 NOTE — Progress Notes (Signed)
Physical Therapy Treatment Note    Therapy Recommendations:  Discharge Recommendations:  Discharge to Planned Living Arrangement with Caregiver Assistance:     Patient's mobility is currently not a barrier to discharge. No further PT visits needed. .   Recommendations:   PT Discharge Equipment Recommended: (P) Rolling walker   Additional justification:   N/A     PT Mobility Recommendations: (P) mod indep RW, may need assist while inpatient for line management  PT Referral Recommendations: (P) OT, Home care       12/24/21 1405   PT Tracking   ([facility]) PT TRACKING PT Discontinue   Visit Number   Visit Number [facility]) / Treatment Day (HH) 0   Visit Details [facility])   Visit Type [facility]) Follow Up-General   Precautions/Observations   Precautions used Yes   Weight Bearing Status RLE PWB  (50%)   Brace Applied Yes   Brace Type Specialty shoe  (RLE HTWB)   Fall Precautions General falls precautions   Activity Orders Present Yes   LDA Observation VAC   Vital Signs Response with Therapy stable   Was patient wearing a mask? No   PPE worn by Clinical research associate [facility]   Patient Subjective Pt agreeable to skilled PT   Current Pain Assessment   Pain Assessment / Reassessment Assessment   Pain Scale 0-10 (Numeric Scale for Pain Intensity)    0-10 Scale 5   Pain Location/Orientation Ankle Right;Knee Right   Pain Descriptors Aching   Bed Mobility   Bed mobility Tested   Supine to Sit Independent   Sit to Supine Independent   Transfers   Transfers Tested   Weight Bearing Status RLE PWB   Sit to Stand Modified independent (device)   Stand to sit Modified independent (device)   Transfer Assistive Device rolling walker   Additional comments stands with ease from EOB with RW, no assist needed this date. able to maintain RLE PWB well. no c/co increased pain   Mobility   Mobility: Gait/Stairs Tested   Weight Bearing Status RLE PWB   Gait Pattern 3 point;Antalgic right side;Decreased cadence   Ambulation Assist Modified independent (device)   Ambulation  Distance (Feet) 75+75   Ambulation Assistive Device rolling walker   Stairs Assistance Modified independent (device)   Stair Management Technique Two rails;Step to pattern   Number of Stairs 10   Additional comments patient tolerated all ambulation and stairs well this date. no assist needed. VC for sequencing throughout stairs. patient continues to have c/co pain throughout however gait is much more fluid and patient is able to tolerated further distances more consistently now   Training and Education   Patient edu on continuing to improve amb when home   Balance   Balance Tested   Sitting - Static Independent   Sitting - Dynamic Independent   Standing - Static Independent   Additional Comments RW   PT AM-PAC Mobility   Turning over in bed? 4   Moving from lying on back to sitting on the side of the bed? 4   Moving to and from a bed to a chair? 4   Sitting down on and standing up from a chair with arms? 4   Need to walk in hospital room? 4   Climbing 3 - 5 steps with a railing? 4   Total Raw Score 24   AM-PAC T-Scale Score 57.68   Cumulative Ambulation Score (CAM)   Get in and out of bed 2   Sit to stand,  stand to sit from chair 2   Walking 2   Total CAM Score (max 6) 6   Assessment   Brief Assessment Patient demonstrates adequate mobility to discharge to planned living arrangement   Patient / Family Goal get home   Plan/Recommendation   PT Treatment Interventions No further PT interventions   PT Frequency none further   PT Mobility Recommendations mod indep RW, may need assist while inpatient for line management   PT Referral Recommendations OT;Home care   PT Discharge Recommendations Intermittent supervision/assist;Home   PT Discharge Equipment Recommended Rolling walker   Transportation Recommendations any   PT Assessment/Recommendations Reviewed With: Patient;Nursing;Care coordinator;Advanced Practice Provider   Next PT Visit N/A   PT needs to see patient prior to DC  No   Time Calculation   Total Time  Therapeutic Activities (minutes) 10   Total Time Gait Training (minutes) 15   Total Time Therapeutic Exercises (minutes) 0   Total Time Neuromuscular Re-education (minutes) 0   Total Time Group Therapy (minutes) 0   PT Timed Codes 25   PT Untimed Codes 0   PT Unbilled Time 0   PT Total Treatment 25   Plan and Onset date   Plan of Care Date 12/22/21   Onset Date 12/19/21   Treatment Start Date 12/22/21     Lauris Poag, PT, DPT    Please contact physical therapist on the treatment team via secure chat or via the secure chat group for your unit Physical Therapist with any questions.  On weekends, please utilize the secure chat group, [facility] Physical Therapy 1st call, to contact PT.

## 2021-12-24 NOTE — Plan of Care (Signed)
Problem: Impaired Bed Mobility  Goal: STG - IMPROVE BED MOBILITY  Outcome: Completed or Resolved     Problem: Impaired Transfers  Goal: STG - IMPROVE TRANSFERS  Outcome: Completed or Resolved     Problem: Impaired Ambulation  Goal: STG - IMPROVE AMBULATION  Outcome: Completed or Resolved  Note:          Problem: Impaired Stair Navigation  Goal: STG - IMPROVE STAIR NAVIGATION  Outcome: Completed or Resolved

## 2021-12-25 LAB — CBC AND DIFFERENTIAL
Baso # K/uL: 0 10*3/uL (ref 0.0–0.2)
Basophil %: 0.3 %
Eos # K/uL: 0.2 10*3/uL (ref 0.0–0.5)
Eosinophil %: 2.3 %
Hematocrit: 40 % (ref 37–52)
Hemoglobin: 13.4 g/dL (ref 12.0–17.0)
IMM Granulocytes #: 0.1 10*3/uL — ABNORMAL HIGH (ref 0.0–0.0)
IMM Granulocytes: 0.5 %
Lymph # K/uL: 2.7 10*3/uL (ref 1.0–5.0)
Lymphocyte %: 27.4 %
MCH: 29 pg (ref 26–32)
MCHC: 33 g/dL (ref 32–37)
MCV: 88 fL (ref 75–100)
Mono # K/uL: 0.6 10*3/uL (ref 0.1–1.0)
Monocyte %: 6.5 %
Neut # K/uL: 6.1 10*3/uL (ref 1.5–6.5)
Nucl RBC # K/uL: 0 10*3/uL (ref 0.0–0.0)
Nucl RBC %: 0 /100 WBC (ref 0.0–0.2)
Platelets: 350 10*3/uL (ref 150–450)
RBC: 4.6 MIL/uL (ref 4.0–6.0)
RDW: 13.6 % (ref 0.0–15.0)
Seg Neut %: 63 %
WBC: 9.7 10*3/uL (ref 3.5–11.0)

## 2021-12-25 LAB — BASIC METABOLIC PANEL
Anion Gap: 13 (ref 7–16)
CO2: 24 mmol/L (ref 20–28)
Calcium: 9.2 mg/dL (ref 9.0–10.3)
Chloride: 103 mmol/L (ref 96–108)
Creatinine: 0.87 mg/dL (ref 0.67–1.17)
Glucose: 93 mg/dL (ref 60–99)
Lab: 14 mg/dL (ref 6–20)
Potassium: 4.3 mmol/L (ref 3.3–5.1)
Sodium: 140 mmol/L (ref 133–145)
eGFR BY CREAT: 115 *

## 2021-12-25 LAB — AEROBIC CULTURE: Aerobic Culture: 0

## 2021-12-25 LAB — CRP: CRP: 63 mg/L — ABNORMAL HIGH (ref 0–8)

## 2021-12-25 LAB — SEDIMENTATION RATE, AUTOMATED: Sedimentation Rate: 77 mm/hr — ABNORMAL HIGH (ref 0–15)

## 2021-12-25 MED ORDER — ENOXAPARIN SODIUM 40 MG/0.4ML IJ SOSY *I*
40.0000 mg | PREFILLED_SYRINGE | Freq: Two times a day (BID) | INTRAMUSCULAR | Status: DC
Start: 2021-12-25 — End: 2021-12-28
  Administered 2021-12-25 – 2021-12-28 (×6): 40 mg via SUBCUTANEOUS
  Filled 2021-12-25 (×7): qty 0.4

## 2021-12-25 NOTE — Progress Notes (Signed)
Pharmacy Progress Note: Renal Dose Adjustment  This patient is eligible for automatic renal dose adjustment by a pharmacist pursuant to the [facility] Standard Operating Procedure, "Adult Renal Dose Adjustment of Medications by Pharmacists"     Current Assessment of Renal Function:       Lab results: 12/24/21  2141 12/23/21  2155 12/22/21  2214   Creatinine 0.72 0.80 0.82   UN 17 14 12          Renal Assessment:  Normal Renal Function  Weight and BMI Assessment: Weight greater than or equal to 120 kg    Medication Changes:  Based on the current assessment of renal function, weight and indication the following medications have been renally dose adjusted to the dose and frequency listed below:    Enoxaparin:     Indication:  DVT/VTE Prophylaxis    Dose:  40    Frequency:  Q12h      The pharmacist will continue to monitor clinical course and changes in renal function and will perform additional dose adjustments, if needed. For questions or further discussion, please contact the pharmacist at "Pharmacist Internal Med 1" (Vocera)      Trish Mage, PharmD

## 2021-12-25 NOTE — Progress Notes (Addendum)
Orthopaedic Surgery Progress Note  Patient:[name]  MRN: [id number]  DOA: 12/19/2021  Ortho Progress Note for 12/25/2021    Subjective: NAEON. VSS. Afebrile.  Pain controlled. Denies numbness/tingling of the extremities. PICC in place. Cleared PT.  Denies n/v/CP/SOB.     Cxs:   Enterobacter       Objective:  Temp:  [36.1 C (97 F)-36.4 C (97.5 F)] 36.4 C (97.5 F)  Heart Rate:  [75-85] 75  Resp:  [16-18] 16  BP: (124-133)/(77-83) 124/77  Patient Vitals for the past 24 hrs:   BP Temp Temp src Pulse Resp SpO2 Height   12/24/21 2335 124/77 36.4 C (97.5 F) TEMPORAL 75 16 94 % --   12/24/21 1900 -- -- -- -- -- -- 1.8 m (5' 10.87")   12/24/21 1533 133/83 36.1 C (97 F) TEMPORAL 85 16 95 % --   12/24/21 0745 130/81 36.2 C (97.2 F) TEMPORAL 80 18 96 % --       Recent Labs   Lab 12/24/21  2141 12/23/21  2155 12/23/21  1218 12/22/21  2214   WBC 8.5 7.9  --  7.5   Hemoglobin 13.1 13.0 13.9 14.0   Hematocrit 38 38  --  41   Platelets 336 337  --  303       Recent Labs   Lab 12/24/21  2141 12/23/21  2155 12/22/21  2214   Sodium 141 140 139   Potassium 4.3 4.3 4.4   Chloride 104 104 104   CO2 24 24 23      No components found with this basename: BUN, LABGLOM, CALCIUM    Recent Labs   Lab 12/19/21  0846   INR 1.0       Recent Labs   Lab 12/24/21  2141 12/23/21  2155 12/22/21  2214   Sedimentation Rate 87* 78* 91*   CRP 69* 82* 117*       Date 12/24/21 0700 - 12/25/21 0659 12/25/21 0700 - 12/26/21 0659   Shift 0700-0659 24 Hour Total 0700-0659 24 Hour Total   INTAKE   Shift Total(mL/kg)       OUTPUT   Urine(mL/kg/hr) 650 650       Urine 650 650       Urine Occurrence 2 x 2 x     Stool         Stool Occurrence 1 x 1 x     Shift Total(mL/kg) 650(5.1) 650(5.1)     NET -650 -650     Weight (kg) 127 127 127 127         Current Medications:   ertapenem  1,000 mg Intravenous Q24H    docusate sodium  200 mg Oral Nightly    senna  2 tablet Oral Daily    acetaminophen  1,000 mg Oral Q8H    polyethylene glycol  17 g Oral Daily     enoxaparin  40 mg Subcutaneous Q24H        oxyCODONE **OR** oxyCODONE, sodium chloride, sodium chloride, sodium chloride, dextrose, bisacodyl, sodium chloride, dextrose    Exam:  NAD  No respiratory distress    RLE: Dressing c/d/I, prevena in place and holding suction.     SILT sp/dp/saph/sur/tib nerve distributions  SILT over tips of toes  +ADF/APF/EHL/Toe f/e  Brisk CR, palpable DP/PT    Imaging:   CT R tib/fib: Healing fracture, post surgical changes w/ some none specific fluid pockets and subcutaneous emphysema     Assessment and Plan  36  y.o. male admitted on 12/19/2021 now POD#5 s/p R tibia ROH and I&D. NVI.    Antibiotic regimen: Ertapenem, 1g q24h  until 01/31/22  Pain Management: multimodal   DVT prophylaxis: Lovenox   Weight bearing status and activity restrictions: protected WBAT RLE  Wound Care: New prevena applied to medial distal leg incisions.   Postoperative x-rays and radiographic studies: R tibia films, CT R tibia complete  Diet: regular  PT/OT/OOB  Dispo: home care    Zerita Boers, MD   Orthopaedic Surgery Resident  12/25/2021  5:48 AM       Orthopaedic Trauma Attending    I saw and examined the patient.  I have reviewed the resident's note and agree with their finding and plan as documented above.    Sharlene Motts, MD

## 2021-12-25 NOTE — Plan of Care (Signed)
Problem: Safety  Goal: Patient will remain free of falls  Outcome: Maintaining  Goal: Prevent any intentional injury  Outcome: Maintaining     Problem: Pain/Comfort  Goal: Patient's pain or discomfort is manageable  Outcome: Maintaining     Problem: Nutrition  Goal: Patient's nutritional status is maintained or improved  Outcome: Maintaining     Problem: Mobility  Goal: Patient's functional status is maintained or improved  Outcome: Maintaining     Problem: Psychosocial  Goal: Demonstrates ability to cope with illness  Outcome: Maintaining     Problem: Cognitive function  Goal: Cognitive function will be maintained or return to baseline  Description: Interventions:  Delirium Assessment  LIVEBAR Assessment    Outcome: Maintaining  Goal: Lines and tethers will be removed as appropriate  Outcome: Maintaining  Goal: Patient maintains appropriate nutritional intake  Outcome: Maintaining  Goal: Vital signs will be within normal limits  Outcome: Maintaining  Goal: Evidence for potential causes of delirium will be managed  Outcome: Maintaining  Goal: Behaviors will return to baseline  Outcome: Maintaining  Goal: Ambulation and mobility will be maintained  Outcome: Maintaining  Goal: Retention of urine and constipation will be managed  Outcome: Maintaining     Problem: Risk for Impaired Sleep/Wake Cycle  Goal: The patient will maintain an adequate sleep/wake cycle  Outcome: Maintaining     Problem: Post-Operative Hemodynamic Stability  Goal: Maintain Hemodynamic Stability  Outcome: Maintaining     Problem: Post-Operative Complications  Goal: Prevent post-operative complications  Outcome: Maintaining  Goal: Patient will remain free from symptoms of infection-post op  Outcome: Maintaining     Problem: Post-Operative Bowel Elimination  Goal: Elimination pattern is normal or improving  Outcome: Maintaining     Problem: Post-Operative Bladder Elimination  Goal: Patient is able to empty bladder or return to baseline  Outcome:  Maintaining

## 2021-12-26 LAB — CBC AND DIFFERENTIAL
Baso # K/uL: 0 10*3/uL (ref 0.0–0.2)
Basophil %: 0.4 %
Eos # K/uL: 0.2 10*3/uL (ref 0.0–0.5)
Eosinophil %: 2.4 %
Hematocrit: 39 % (ref 37–52)
Hemoglobin: 13 g/dL (ref 12.0–17.0)
IMM Granulocytes #: 0 10*3/uL (ref 0.0–0.0)
IMM Granulocytes: 0.5 %
Lymph # K/uL: 2 10*3/uL (ref 1.0–5.0)
Lymphocyte %: 23.5 %
MCH: 30 pg (ref 26–32)
MCHC: 34 g/dL (ref 32–37)
MCV: 89 fL (ref 75–100)
Mono # K/uL: 0.6 10*3/uL (ref 0.1–1.0)
Monocyte %: 7.2 %
Neut # K/uL: 5.5 10*3/uL (ref 1.5–6.5)
Nucl RBC # K/uL: 0 10*3/uL (ref 0.0–0.0)
Nucl RBC %: 0 /100 WBC (ref 0.0–0.2)
Platelets: 335 10*3/uL (ref 150–450)
RBC: 4.3 MIL/uL (ref 4.0–6.0)
RDW: 13.6 % (ref 0.0–15.0)
Seg Neut %: 66 %
WBC: 8.4 10*3/uL (ref 3.5–11.0)

## 2021-12-26 NOTE — Progress Notes (Addendum)
Orthopaedic Surgery Progress Note  Patient:[name]  MRN: [id number]  DOA: 12/19/2021  Ortho Progress Note for 12/26/2021    Subjective: NAEON. VSS. Afebrile.  Pain controlled. Denies numbness/tingling of the extremities. PICC in place. Cleared PT.  In house over the weekend due to home care needs.     Denies n/v/CP/SOB.         Objective:  Temp:  [36.1 C (97 F)-36.6 C (97.9 F)] 36.6 C (97.9 F)  Heart Rate:  [84-86] 84  Resp:  [16-18] 18  BP: (115)/(71-74) 115/71  FiO2:  [0 %] 0 %  Patient Vitals for the past 24 hrs:   BP Temp Temp src Pulse Resp SpO2   12/26/21 0619 115/71 36.6 C (97.9 F) TEMPORAL 84 18 96 %   12/25/21 2302 115/74 36.1 C (97 F) TEMPORAL 86 16 98 %     Recent Labs   Lab 12/25/21  2024 12/24/21  2141 12/23/21  2155   WBC 9.7 8.5 7.9   Hemoglobin 13.4 13.1 13.0   Hematocrit 40 38 38   Platelets 350 336 337     Recent Labs   Lab 12/25/21  2024 12/24/21  2141 12/23/21  2155   Sodium 140 141 140   Potassium 4.3 4.3 4.3   Chloride 103 104 104   CO2 24 24 24      No components found with this basename: "BUN", "LABGLOM", "CALCIUM"    Recent Labs   Lab 12/19/21  0846   INR 1.0     Recent Labs   Lab 12/25/21  2024 12/24/21  2141 12/23/21  2155   Sedimentation Rate 77* 87* 78*   CRP 63* 69* 82*         Current Medications:   enoxaparin  40 mg Subcutaneous Q12H    ertapenem  1,000 mg Intravenous Q24H    docusate sodium  200 mg Oral Nightly    senna  2 tablet Oral Daily    acetaminophen  1,000 mg Oral Q8H    polyethylene glycol  17 g Oral Daily        oxyCODONE **OR** oxyCODONE, sodium chloride, sodium chloride, sodium chloride, dextrose, bisacodyl, sodium chloride, dextrose    Exam:  NAD  No respiratory distress    RLE: Dressing c/d/I, prevena in place and holding suction.     SILT sp/dp/saph/sur/tib nerve distributions  SILT over tips of toes  +ADF/APF/EHL/Toe f/e  Brisk CR, palpable DP/PT    Imaging:   CT R tib/fib: Healing fracture, post surgical changes w/ some none specific fluid pockets and  subcutaneous emphysema     Assessment and Plan  36 y.o. male admitted on 12/19/2021 now POD#6 s/p R tibia ROH and I&D. NVI.    Antibiotic regimen: Ertapenem, 1g q24h  until 01/31/22  Pain Management: multimodal   DVT prophylaxis: Lovenox   Weight bearing status and activity restrictions: protected WBAT RLE  Postoperative x-rays and radiographic studies: R tibia films, CT R tibia complete  Diet: regular  PT/OT/OOB  Dispo: Plan for home tomorrow once home care set up     Katy Apo, MD   Orthopaedic Surgery Resident  12/26/2021  7:08 AM     Orthopaedic Trauma Attending    Patient was off floor at time of my visit. I have reviewed the resident's note, discussed with him and agree with their finding and plan as documented above.    Sharlene Motts, MD

## 2021-12-26 NOTE — Plan of Care (Signed)
Problem: Safety  Goal: Patient will remain free of falls  Outcome: Maintaining  Goal: Prevent any intentional injury  Outcome: Maintaining     Problem: Pain/Comfort  Goal: Patient's pain or discomfort is manageable  Outcome: Maintaining     Problem: Nutrition  Goal: Patient's nutritional status is maintained or improved  Outcome: Maintaining     Problem: Mobility  Goal: Patient's functional status is maintained or improved  Outcome: Maintaining     Problem: Psychosocial  Goal: Demonstrates ability to cope with illness  Outcome: Maintaining     Problem: Cognitive function  Goal: Cognitive function will be maintained or return to baseline  Description: Interventions:  Delirium Assessment  LIVEBAR Assessment    Outcome: Maintaining  Goal: Lines and tethers will be removed as appropriate  Outcome: Maintaining  Goal: Patient maintains appropriate nutritional intake  Outcome: Maintaining  Goal: Vital signs will be within normal limits  Outcome: Maintaining  Goal: Evidence for potential causes of delirium will be managed  Outcome: Maintaining  Goal: Behaviors will return to baseline  Outcome: Maintaining  Goal: Ambulation and mobility will be maintained  Outcome: Maintaining  Goal: Retention of urine and constipation will be managed  Outcome: Maintaining     Problem: Risk for Impaired Sleep/Wake Cycle  Goal: The patient will maintain an adequate sleep/wake cycle  Outcome: Maintaining     Problem: Post-Operative Hemodynamic Stability  Goal: Maintain Hemodynamic Stability  Outcome: Maintaining     Problem: Post-Operative Complications  Goal: Prevent post-operative complications  Outcome: Maintaining  Goal: Patient will remain free from symptoms of infection-post op  Outcome: Maintaining     Problem: Post-Operative Bowel Elimination  Goal: Elimination pattern is normal or improving  Outcome: Maintaining     Problem: Post-Operative Bladder Elimination  Goal: Patient is able to empty bladder or return to baseline  Outcome:  Maintaining

## 2021-12-27 ENCOUNTER — Other Ambulatory Visit: Payer: Self-pay | Admitting: Infectious Diseases

## 2021-12-27 DIAGNOSIS — T847XXD Infection and inflammatory reaction due to other internal orthopedic prosthetic devices, implants and grafts, subsequent encounter: Secondary | ICD-10-CM

## 2021-12-27 LAB — CBC AND DIFFERENTIAL
Baso # K/uL: 0 10*3/uL (ref 0.0–0.2)
Basophil %: 0.4 %
Eos # K/uL: 0.2 10*3/uL (ref 0.0–0.5)
Eosinophil %: 2.4 %
Hematocrit: 38 % (ref 37–52)
Hemoglobin: 13 g/dL (ref 12.0–17.0)
IMM Granulocytes #: 0 10*3/uL (ref 0.0–0.0)
IMM Granulocytes: 0.5 %
Lymph # K/uL: 2.2 10*3/uL (ref 1.0–5.0)
Lymphocyte %: 25.7 %
MCH: 30 pg (ref 26–32)
MCHC: 34 g/dL (ref 32–37)
MCV: 89 fL (ref 75–100)
Mono # K/uL: 0.5 10*3/uL (ref 0.1–1.0)
Monocyte %: 5.3 %
Neut # K/uL: 5.6 10*3/uL (ref 1.5–6.5)
Nucl RBC # K/uL: 0 10*3/uL (ref 0.0–0.0)
Nucl RBC %: 0 /100 WBC (ref 0.0–0.2)
Platelets: 327 10*3/uL (ref 150–450)
RBC: 4.3 MIL/uL (ref 4.0–6.0)
RDW: 13.7 % (ref 0.0–15.0)
Seg Neut %: 65.7 %
WBC: 8.5 10*3/uL (ref 3.5–11.0)

## 2021-12-27 MED ORDER — WALKER MISC *A*
0 refills | Status: AC
Start: 2021-12-27 — End: ?

## 2021-12-27 MED ORDER — HEPARIN LOCK FLUSH 10 UNIT/ML IJ SOLN WRAPPED *I*
5.0000 mL | INTRAVENOUS | 5 refills | Status: DC | PRN
Start: 2021-12-27 — End: 2022-02-04

## 2021-12-27 MED ORDER — RAISED TOILET SEAT MISC *A*
0 refills | Status: AC
Start: 2021-12-27 — End: ?

## 2021-12-27 MED ORDER — SODIUM CHLORIDE 0.9 % INJ (FLUSH) WRAPPED *I*
10.0000 mL | 5 refills | Status: DC | PRN
Start: 2021-12-27 — End: 2022-02-04

## 2021-12-27 MED ORDER — ERTAPENEM SODIUM 1 GM INJ SOLR (100 MG/ML) *WRAPPED*
1000.0000 mg | 4 refills | Status: AC
Start: 2021-12-27 — End: 2022-01-31

## 2021-12-27 MED ORDER — POLYETHYLENE GLYCOL 3350 PO PACK 17 GM *I*
17.0000 g | PACK | Freq: Every day | ORAL | Status: DC
Start: 2021-12-27 — End: 2021-12-28

## 2021-12-27 NOTE — Progress Notes (Shared)
Orthopaedic Surgery Progress Note  Patient:[name]  MRN: [id number]  DOA: 12/19/2021  Ortho Progress Note for 12/27/2021    Subjective: NAEON. VSS. Afebrile.  Pain controlled. Denies numbness/tingling of the extremities. PICC in place. Cleared PT.      Denies n/v/CP/SOB.         Objective:  Temp:  [36.2 C (97.2 F)-36.7 C (98.1 F)] 36.2 C (97.2 F)  Heart Rate:  [72-89] 72  Resp:  [16-18] 16  BP: (109-120)/(70-80) 109/70  FiO2:  [0 %] 0 %  Patient Vitals for the past 24 hrs:   BP Temp Temp src Pulse Resp SpO2   12/27/21 0327 109/70 36.2 C (97.2 F) TEMPORAL 72 16 95 %   12/26/21 1638 120/80 36.7 C (98.1 F) TEMPORAL 89 18 97 %   12/26/21 0619 115/71 36.6 C (97.9 F) TEMPORAL 84 18 96 %     Recent Labs   Lab 12/26/21  2114 12/25/21  2024 12/24/21  2141   WBC 8.4 9.7 8.5   Hemoglobin 13.0 13.4 13.1   Hematocrit 39 40 38   Platelets 335 350 336     Recent Labs   Lab 12/25/21  2024 12/24/21  2141 12/23/21  2155   Sodium 140 141 140   Potassium 4.3 4.3 4.3   Chloride 103 104 104   CO2 24 24 24      No components found with this basename: "BUN", "LABGLOM", "CALCIUM"    No results for input(s): "APTT", "INR", "PTT" in the last 168 hours.    Recent Labs   Lab 12/25/21  2024 12/24/21  2141 12/23/21  2155   Sedimentation Rate 77* 87* 78*   CRP 63* 69* 82*         Current Medications:   enoxaparin  40 mg Subcutaneous Q12H    ertapenem  1,000 mg Intravenous Q24H    docusate sodium  200 mg Oral Nightly    senna  2 tablet Oral Daily    acetaminophen  1,000 mg Oral Q8H    polyethylene glycol  17 g Oral Daily        oxyCODONE **OR** oxyCODONE, sodium chloride, sodium chloride, sodium chloride, dextrose, bisacodyl, sodium chloride, dextrose    Exam:  NAD  No respiratory distress    RLE: Dressing c/d/I, prevena in place and holding suction.     SILT sp/dp/saph/sur/tib nerve distributions  SILT over tips of toes  +ADF/APF/EHL/Toe f/e  Brisk CR, palpable DP/PT    Imaging:   CT R tib/fib: Healing fracture, post surgical changes  w/ some none specific fluid pockets and subcutaneous emphysema     Assessment and Plan  36 y.o. male admitted on 12/19/2021 now POD#7 s/p R tibia ROH and I&D. NVI.    Antibiotic regimen: Ertapenem, 1g q24h  until 01/31/22  Pain Management: multimodal   DVT prophylaxis: Lovenox   Weight bearing status and activity restrictions: protected WBAT RLE  Postoperative x-rays and radiographic studies: R tibia films, CT R tibia complete  Diet: regular  PT/OT/OOB  Dispo: Home today     Zerita Boers, MD   Orthopaedic Surgery Resident  12/27/2021  5:20 AM

## 2021-12-27 NOTE — Progress Notes (Signed)
Patient is in the wheelchair going to the cafeteria for lunch; wound vac is secured to the back of the chair, patient turns and pivots on his own without assistance to get into the wheelchair, VSS, room air, denies pain level over 6, calm and following directions.

## 2021-12-27 NOTE — Progress Notes (Signed)
Orthopaedic Surgery Progress Note  Patient:[name]  MRN: [id number]  DOA: 12/19/2021  Ortho Progress Note for 12/27/2021    Subjective: NAEON. VSS. Afebrile.  Pain controlled. Denies numbness/tingling of the extremities. PICC in place. Cleared PT.  In house over the weekend due to home care needs.     Denies n/v/CP/SOB.       Objective:  Temp:  [36.2 C (97.2 F)-36.7 C (98.1 F)] 36.2 C (97.2 F)  Heart Rate:  [72-89] 72  Resp:  [16-18] 16  BP: (109-120)/(70-80) 109/70  FiO2:  [0 %] 0 %  Patient Vitals for the past 24 hrs:   BP Temp Temp src Pulse Resp SpO2   12/27/21 0327 109/70 36.2 C (97.2 F) TEMPORAL 72 16 95 %   12/26/21 1638 120/80 36.7 C (98.1 F) TEMPORAL 89 18 97 %   12/26/21 0619 115/71 36.6 C (97.9 F) TEMPORAL 84 18 96 %     Recent Labs   Lab 12/26/21  2114 12/25/21  2024 12/24/21  2141   WBC 8.4 9.7 8.5   Hemoglobin 13.0 13.4 13.1   Hematocrit 39 40 38   Platelets 335 350 336     Recent Labs   Lab 12/25/21  2024 12/24/21  2141 12/23/21  2155   Sodium 140 141 140   Potassium 4.3 4.3 4.3   Chloride 103 104 104   CO2 24 24 24      No components found with this basename: "BUN", "LABGLOM", "CALCIUM"    No results for input(s): "APTT", "INR", "PTT" in the last 168 hours.    Recent Labs   Lab 12/25/21  2024 12/24/21  2141 12/23/21  2155   Sedimentation Rate 77* 87* 78*   CRP 63* 69* 82*         Current Medications:   enoxaparin  40 mg Subcutaneous Q12H    ertapenem  1,000 mg Intravenous Q24H    docusate sodium  200 mg Oral Nightly    senna  2 tablet Oral Daily    acetaminophen  1,000 mg Oral Q8H    polyethylene glycol  17 g Oral Daily        oxyCODONE **OR** oxyCODONE, sodium chloride, sodium chloride, sodium chloride, dextrose, bisacodyl, sodium chloride, dextrose    Exam:  NAD  No respiratory distress    RLE: Dressing c/d/I, prevena in place and holding suction.     SILT sp/dp/saph/sur/tib nerve distributions  SILT over tips of toes  +ADF/APF/EHL/Toe f/e  Brisk CR, palpable DP/PT    Imaging:   CT R  tib/fib: Healing fracture, post surgical changes w/ some none specific fluid pockets and subcutaneous emphysema     Assessment and Plan  36 y.o. male admitted on 12/19/2021 now POD#7 s/p R tibia ROH and I&D. NVI.    Antibiotic regimen: Ertapenem, 1g q24h  until 01/31/22  Pain Management: multimodal   DVT prophylaxis: Lovenox   Weight bearing status and activity restrictions: protected WBAT RLE  Postoperative x-rays and radiographic studies: R tibia films, CT R tibia complete  Diet: regular  PT/OT/OOB  Dispo: Plan for home tomorrow once home care set up     Katy Apo, MD   Orthopaedic Surgery Resident  12/27/2021  6:04 AM

## 2021-12-27 NOTE — Progress Notes (Signed)
Home Infusion Service Therapy Start    Patient [name] is beginning Ertapenem IV  therapy with the [facility] service as of their pending discharge. Please contact our RN liaison Gillian Scarce once the discharge has been confirmed. Delivery of the supplies will be discussed with the patient once the discharge has been planned and will be delivered either to the bedside or to the home depending on patient preference.      Any copay or out of pocket costs have already been discussed with the patient by the Home Infusion Intake team.    For Drug and Supply related questions, dose changes or new IV drug orders please contact the pharmacy at [phone], 8:00 AM to 4:30 PM Monday-Friday or [phone] after hours.     For Insurance qualification questions please contact the Intake department at [phone] or via the in-basket HOME INFUSION INTAKE pool. The intake team is available Mon-Fri from 8:00 AM - 4:30 PM without any after hours support.     Thank you,    Brendan Gruwell Heriberto Antigua, PharmD

## 2021-12-27 NOTE — Plan of Care (Signed)
Problem: Safety  Goal: Patient will remain free of falls  12/27/2021 2139 by Lucilla Edin, RN  Outcome: Maintaining  12/27/2021 2139 by Lucilla Edin, RN  Outcome: Maintaining  Goal: Prevent any intentional injury  12/27/2021 2139 by Lucilla Edin, RN  Outcome: Maintaining  12/27/2021 2139 by Lucilla Edin, RN  Outcome: Maintaining     Problem: Pain/Comfort  Goal: Patient's pain or discomfort is manageable  12/27/2021 2139 by Lucilla Edin, RN  Outcome: Maintaining  12/27/2021 2139 by Lucilla Edin, RN  Outcome: Maintaining     Problem: Nutrition  Goal: Patient's nutritional status is maintained or improved  12/27/2021 2139 by Lucilla Edin, RN  Outcome: Maintaining  12/27/2021 2139 by Lucilla Edin, RN  Outcome: Maintaining     Problem: Mobility  Goal: Patient's functional status is maintained or improved  12/27/2021 2139 by Lucilla Edin, RN  Outcome: Maintaining  12/27/2021 2139 by Lucilla Edin, RN  Outcome: Maintaining     Problem: Psychosocial  Goal: Demonstrates ability to cope with illness  12/27/2021 2139 by Lucilla Edin, RN  Outcome: Maintaining  12/27/2021 2139 by Lucilla Edin, RN  Outcome: Maintaining     Problem: Cognitive function  Goal: Cognitive function will be maintained or return to baseline  Description: Interventions:  Delirium Assessment  LIVEBAR Assessment    12/27/2021 2139 by Lucilla Edin, RN  Outcome: Maintaining  12/27/2021 2139 by Lucilla Edin, RN  Outcome: Maintaining  Goal: Lines and tethers will be removed as appropriate  12/27/2021 2139 by Lucilla Edin, RN  Outcome: Maintaining  12/27/2021 2139 by Lucilla Edin, RN  Outcome: Maintaining  Goal: Patient maintains appropriate nutritional intake  12/27/2021 2139 by Lucilla Edin, RN  Outcome: Maintaining  12/27/2021 2139 by Lucilla Edin, RN  Outcome: Maintaining  Goal: Vital signs will be within normal limits  12/27/2021 2139 by Lucilla Edin, RN  Outcome: Maintaining  12/27/2021 2139 by  Lucilla Edin, RN  Outcome: Maintaining  Goal: Evidence for potential causes of delirium will be managed  12/27/2021 2139 by Lucilla Edin, RN  Outcome: Maintaining  12/27/2021 2139 by Lucilla Edin, RN  Outcome: Maintaining  Goal: Behaviors will return to baseline  12/27/2021 2139 by Lucilla Edin, RN  Outcome: Maintaining  12/27/2021 2139 by Lucilla Edin, RN  Outcome: Maintaining  Goal: Ambulation and mobility will be maintained  12/27/2021 2139 by Lucilla Edin, RN  Outcome: Maintaining  12/27/2021 2139 by Lucilla Edin, RN  Outcome: Maintaining  Goal: Retention of urine and constipation will be managed  12/27/2021 2139 by Lucilla Edin, RN  Outcome: Maintaining  12/27/2021 2139 by Lucilla Edin, RN  Outcome: Maintaining     Problem: Risk for Impaired Sleep/Wake Cycle  Goal: The patient will maintain an adequate sleep/wake cycle  12/27/2021 2139 by Lucilla Edin, RN  Outcome: Maintaining  12/27/2021 2139 by Lucilla Edin, RN  Outcome: Maintaining     Problem: Post-Operative Hemodynamic Stability  Goal: Maintain Hemodynamic Stability  12/27/2021 2139 by Lucilla Edin, RN  Outcome: Maintaining  12/27/2021 2139 by Lucilla Edin, RN  Outcome: Maintaining     Problem: Post-Operative Complications  Goal: Prevent post-operative complications  12/27/2021 2139 by Lucilla Edin, RN  Outcome: Maintaining  12/27/2021 2139 by Lucilla Edin, RN  Outcome: Maintaining  Goal: Patient will remain free from symptoms of infection-post op  12/27/2021 2139 by Lucilla Edin, RN  Outcome: Maintaining  12/27/2021 2139 by Lucilla Edin, RN  Outcome: Maintaining

## 2021-12-27 NOTE — Progress Notes (Signed)
Orders placed for home IV ertapenem [facility].  Labs ordered for every 2 weeks per plan of care of note

## 2021-12-27 NOTE — Continuity of Care (Addendum)
Confirmed with [facility] that teach will take place tomorrow 12/28/2021 around 11 AM.    Notified home care of discharge tomorrow. Patient will need to adhere to PCP appointment on 11/15 in order for home care to open patient to services. Provider will speak with patient regarding this.     Alerted that patient will need a Architectural technologist. Writer reached out to [facility], script(s) to either be faxed or escribed once available.      DME to be delivered bedside prior to discharge.       [facility] will continue to follow patient's hospital course until discharge plans are finalized.     Lazarus Salines, RN   Acute Care Coordinator  Cell: [phone]  Weekend covering Coordinator: [phone]

## 2021-12-27 NOTE — Plan of Care (Signed)
Problem: Safety  Goal: Patient will remain free of falls  Outcome: Maintaining  Goal: Prevent any intentional injury  Outcome: Maintaining     Problem: Pain/Comfort  Goal: Patient's pain or discomfort is manageable  Outcome: Maintaining     Problem: Nutrition  Goal: Patient's nutritional status is maintained or improved  Outcome: Maintaining     Problem: Mobility  Goal: Patient's functional status is maintained or improved  Outcome: Maintaining     Problem: Psychosocial  Goal: Demonstrates ability to cope with illness  Outcome: Maintaining     Problem: Cognitive function  Goal: Cognitive function will be maintained or return to baseline  Description: Interventions:  Delirium Assessment  LIVEBAR Assessment    Outcome: Maintaining  Goal: Lines and tethers will be removed as appropriate  Outcome: Maintaining  Goal: Patient maintains appropriate nutritional intake  Outcome: Maintaining  Goal: Vital signs will be within normal limits  Outcome: Maintaining  Goal: Evidence for potential causes of delirium will be managed  Outcome: Maintaining  Goal: Behaviors will return to baseline  Outcome: Maintaining  Goal: Ambulation and mobility will be maintained  Outcome: Maintaining  Goal: Retention of urine and constipation will be managed  Outcome: Maintaining     Problem: Risk for Impaired Sleep/Wake Cycle  Goal: The patient will maintain an adequate sleep/wake cycle  Outcome: Maintaining     Problem: Post-Operative Hemodynamic Stability  Goal: Maintain Hemodynamic Stability  Outcome: Maintaining     Problem: Post-Operative Complications  Goal: Prevent post-operative complications  Outcome: Maintaining  Goal: Patient will remain free from symptoms of infection-post op  Outcome: Maintaining
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
## 2021-12-27 NOTE — Plan of Care (Signed)
Problem: Safety  Goal: Patient will remain free of falls  Outcome: Maintaining  Goal: Prevent any intentional injury  Outcome: Maintaining     Problem: Pain/Comfort  Goal: Patient's pain or discomfort is manageable  Outcome: Maintaining     Problem: Nutrition  Goal: Patient's nutritional status is maintained or improved  Outcome: Maintaining     Problem: Mobility  Goal: Patient's functional status is maintained or improved  Outcome: Maintaining     Problem: Psychosocial  Goal: Demonstrates ability to cope with illness  Outcome: Maintaining     Problem: Cognitive function  Goal: Cognitive function will be maintained or return to baseline  Description: Interventions:  Delirium Assessment  LIVEBAR Assessment    Outcome: Maintaining  Goal: Lines and tethers will be removed as appropriate  Outcome: Maintaining  Goal: Patient maintains appropriate nutritional intake  Outcome: Maintaining  Goal: Vital signs will be within normal limits  Outcome: Maintaining  Goal: Evidence for potential causes of delirium will be managed  Outcome: Maintaining  Goal: Behaviors will return to baseline  Outcome: Maintaining  Goal: Ambulation and mobility will be maintained  Outcome: Maintaining  Goal: Retention of urine and constipation will be managed  Outcome: Maintaining     Problem: Risk for Impaired Sleep/Wake Cycle  Goal: The patient will maintain an adequate sleep/wake cycle  Outcome: Maintaining     Problem: Post-Operative Hemodynamic Stability  Goal: Maintain Hemodynamic Stability  Outcome: Maintaining     Problem: Post-Operative Complications  Goal: Prevent post-operative complications  Outcome: Maintaining  Goal: Patient will remain free from symptoms of infection-post op  Outcome: Maintaining     Problem: Post-Operative Bowel Elimination  Goal: Elimination pattern is normal or improving  Outcome: Completed or Resolved     Problem: Post-Operative Bladder Elimination  Goal: Patient is able to empty bladder or return to  baseline  Outcome: Completed or Resolved

## 2021-12-28 ENCOUNTER — Other Ambulatory Visit: Payer: Self-pay

## 2021-12-28 ENCOUNTER — Non-Acute Institutional Stay: Payer: MEDICAID | Attending: Internal Medicine

## 2021-12-28 MED ORDER — ENOXAPARIN SODIUM 40 MG/0.4ML IJ SOSY *I*
40.0000 mg | PREFILLED_SYRINGE | Freq: Two times a day (BID) | INTRAMUSCULAR | 0 refills | Status: AC
Start: 2021-12-28 — End: 2022-01-27
  Filled 2021-12-28: qty 24, 30d supply, fill #0

## 2021-12-28 MED ORDER — ACETAMINOPHEN 500 MG PO TABS *I*
1000.0000 mg | ORAL_TABLET | Freq: Three times a day (TID) | ORAL | 0 refills | Status: DC
Start: 2021-12-28 — End: 2022-01-05
  Filled 2021-12-28: qty 100, 17d supply, fill #0

## 2021-12-28 MED ORDER — OXYCODONE HCL 10 MG PO TABS *I*
10.0000 mg | ORAL_TABLET | ORAL | 0 refills | Status: DC | PRN
Start: 2021-12-28 — End: 2022-01-03
  Filled 2021-12-28: qty 40, 7d supply, fill #0

## 2021-12-28 MED ORDER — SENNOSIDES 8.6 MG PO TABS *I*
2.0000 | ORAL_TABLET | Freq: Every day | ORAL | 0 refills | Status: AC
Start: 2021-12-29 — End: 2022-01-28
  Filled 2021-12-28: qty 60, 30d supply, fill #0

## 2021-12-28 MED ORDER — POLYETHYLENE GLYCOL 3350 PO POWD *I*
17.0000 g | Freq: Every day | ORAL | 0 refills | Status: AC
Start: 2021-12-29 — End: 2022-01-12
  Filled 2021-12-28: qty 238, 14d supply, fill #0

## 2021-12-28 NOTE — Progress Notes (Signed)
Orthopaedic Surgery Progress Note  Patient:[name]  MRN: [id number]  DOA: 12/19/2021  Ortho Progress Note for 12/28/2021    Subjective: NAEON. VSS. Afebrile.  Pain controlled. Denies numbness/tingling of the extremities. PICC in place. Cleared PT.  Ready to go home today. Home infusion teach today.     Denies n/v/CP/SOB.       Objective:  Temp:  [36.2 C (97.1 F)-36.8 C (98.2 F)] 36.8 C (98.2 F)  Heart Rate:  [77-92] 90  Resp:  [16] 16  BP: (110-146)/(60-101) 110/60  Patient Vitals for the past 24 hrs:   BP Temp Temp src Pulse Resp SpO2   12/27/21 2303 110/60 36.8 C (98.2 F) TEMPORAL 90 16 99 %   12/27/21 1555 145/86 36.3 C (97.3 F) TEMPORAL 77 16 100 %   12/27/21 1100 124/84 36.2 C (97.2 F) TEMPORAL 80 16 97 %   12/27/21 0731 (!) 146/101 36.2 C (97.1 F) TEMPORAL 92 16 98 %     Recent Labs   Lab 12/27/21  2039 12/26/21  2114 12/25/21  2024   WBC 8.5 8.4 9.7   Hemoglobin 13.0 13.0 13.4   Hematocrit 38 39 40   Platelets 327 335 350     Recent Labs   Lab 12/25/21  2024 12/24/21  2141 12/23/21  2155   Sodium 140 141 140   Potassium 4.3 4.3 4.3   Chloride 103 104 104   CO2 24 24 24      No components found with this basename: "BUN", "LABGLOM", "CALCIUM"    No results for input(s): "APTT", "INR", "PTT" in the last 168 hours.    Recent Labs   Lab 12/25/21  2024 12/24/21  2141 12/23/21  2155   Sedimentation Rate 77* 87* 78*   CRP 63* 69* 82*     Date 12/27/21 0700 - 12/28/21 0659 12/28/21 0700 - 12/29/21 0659   Shift 0700-0659 24 Hour Total 0700-0659 24 Hour Total   INTAKE   P.O. 440 440       P.O. 440 440     Shift Total(mL/kg) 440(3.5) 440(3.5)     OUTPUT   Urine(mL/kg/hr) 1000 1000       Urine 1000 1000     Shift Total(mL/kg) 1000(7.9) 1000(7.9)     NET -560 -560     Weight (kg) 127 127 127 127       Current Medications:   polyethylene glycol  17 g Oral Daily    enoxaparin  40 mg Subcutaneous Q12H    ertapenem  1,000 mg Intravenous Q24H    docusate sodium  200 mg Oral Nightly    senna  2 tablet Oral Daily     acetaminophen  1,000 mg Oral Q8H    polyethylene glycol  17 g Oral Daily        oxyCODONE **OR** oxyCODONE, sodium chloride, sodium chloride, sodium chloride, dextrose, bisacodyl, sodium chloride, dextrose    Exam:  NAD  No respiratory distress    RLE: Dressing c/d/I, prevena in place and holding suction.     SILT sp/dp/saph/sur/tib nerve distributions  SILT over tips of toes  +ADF/APF/EHL/Toe f/e  Brisk CR, palpable DP/PT    Imaging:   CT R tib/fib: Healing fracture, post surgical changes w/ some none specific fluid pockets and subcutaneous emphysema     Assessment and Plan  36 y.o. male admitted on 12/19/2021 now POD#8 s/p R tibia ROH and I&D. NVI.    Antibiotic regimen: Ertapenem, 1g q24h  until 01/31/22  Pain  Management: multimodal   DVT prophylaxis: Lovenox   Weight bearing status and activity restrictions: protected WBAT RLE  Postoperative x-rays and radiographic studies: R tibia films, CT R tibia complete  Diet: regular  PT/OT/OOB  Dispo: Home today after abx teach     Zerita Boers, MD   Orthopaedic Surgery Resident  12/28/2021  5:24 AM

## 2021-12-28 NOTE — Progress Notes (Signed)
[facility] SOCIAL WORK  PHARMACY FORM     Today's date:  December 28, 2021    Patient Name: [name]      Medical Record #: [id number]   DOB: 01-29-1986  Patient's Address: [address] [place]              Social Worker: Weyman Bogdon Karolee Ohs, LMSW       Date of Service: December 28, 2021       Funding Source: SW Medication Assistance Fund  ___________________________________________________________________    Pharmacy Information:  Date/time sent: December 28, 2021     Time needed: Today    Patient Location: Inpatient (specify unit)  [phone]    Medication Pick-up Preference: Have Discharge Pharmacy Team bring to unit     Pharmacy Contact: Carrie P.  Social work signature: Environmental manager, LMSW  Supervisor/Manager Approval (if indicated):  Date: 12/28/21  (supervisor signature not required for Medicaid pending)

## 2021-12-28 NOTE — Progress Notes (Signed)
Met with patient at bedside for home infusion teaching of IV ertapenem every 24 hours via SL PICC by gravity minibag plus. [name] gave return demonstration using teaching supplies and is able and willing to do this therapy at home.  Provided with written instructions and [facility] welcome packet with 24/7 phone number.      Verified home address and phone numbers.      SL PICC is patent with brisk blood return, dressing reinforced with tape, writer requested a dressing change prior to d/c. Bedside RN aware. Meds/supplies t be delivered to bedside. Please place an extension on picc when the supplies arrive.    [name] [facility] aware of the above.     Thank you,    Gillian Scarce, RN   Home Infusion Care Coordinator  [facility]  C: [phone] (M-F 8-4:30)  O: [phone] after hours  F: [phone]

## 2021-12-28 NOTE — Interdisciplinary Rounds (Signed)
Interdisciplinary Discharge Communication Note    Medically Ready for Discharge Date: 12/28/21   Targeted Discharge Disposition : Home with services    Rounding was performed on: IDR Date: 12/28/21 at: IDR Time: 0900    Interdisciplinary Rounding Participants: APP, SW, Care Coordinator, Charge RN, Engineer, drilling, PT, Nursing Supervisor    Admit Date/Time:  12/19/2021  7:51 AM     Principal Problem:  S/P RLE removal of hardware, I&D  The patient's problem list and interdisciplinary care plan was reviewed.    Expected Date for Discharge: 12/28/2021      Discharge Planning    Targeted Disposition   Targeted Discharge Disposition : Home with services    Home Care  Home Care, Choiced?: Yes   Accepting Home Care Agency: [facility]       Medical Equipment Needs  Current Home Equipment: Cane (straight), Wheelchair (manual), Grab bars in shower/tub, Grab bars around toilet (low toilet)  Respiratory Home Equipment : Not Applicable   Medical Supplies Available: Not Applicable  Equipment/Supplies Ordered: Agricultural consultant, Other (comment) (RTS)   Equipment/Supply Vendor: [id number]         Communication  Is communication necessary with patient spokesperson? : No  Why communication is not necessary: Patient has capacity          Current PCP:  Provider, Unknown    Scheduled Future Appointments:  Follow-up Appointments: Follow-up appointment with PCP made, Other follow-up appointments  Other Follow-Up Appointments: Orthopedics  Future Appointments      Wednesday December 29, 2021 10:10 AM  Discharge Follow Up with Butch Penny, NP  [facility] (--) [phone]     Thursday December 30, 2021 12:00 PM  Video Visit with Edyth Gunnels, NP  [facility] (--)  Arrive at: Video Visit with Patient [phone]     Tuesday January 04, 2022 10:20 AM  FOLLOW UP VISIT with Edwinna Areola, PA  [facility] (--) [phone]              Transportation  Transportation Setup: Complete        SW Financial/Voucher Assistance for Transportation: No    Prescriptions   Prescriptions sent to outpatient pharmacy: Complete         Benjamin Stain, RN  9:58 AM

## 2021-12-28 NOTE — Continuity of Care (Signed)
Patient discharging to [address] [place] [address].    Notified home care of address update.    IV abx teach completed. Patient will need to stay for today's dose. PCP appointment tomorrow with home care visit following the next day.    [facility] will continue to follow patient's hospital course until discharge plans are finalized.     Lazarus Salines, RN   Acute Care Coordinator  Cell: [phone]  Weekend covering Coordinator: [phone]

## 2021-12-28 NOTE — Progress Notes (Signed)
Pt educated on and demonstrated proper Lovenox self-injection.  Handout education provided.  Pt verbalizes understanding.  Denies any questions or concerns.

## 2021-12-29 ENCOUNTER — Encounter: Payer: Self-pay | Admitting: Adult Health

## 2021-12-29 ENCOUNTER — Telehealth: Payer: Self-pay

## 2021-12-29 ENCOUNTER — Other Ambulatory Visit: Payer: Self-pay

## 2021-12-29 ENCOUNTER — Ambulatory Visit: Payer: MEDICAID | Attending: Adult Health | Admitting: Adult Health

## 2021-12-29 VITALS — BP 155/71 | HR 89 | Temp 96.9°F | Wt 289.9 lb

## 2021-12-29 DIAGNOSIS — T847XXD Infection and inflammatory reaction due to other internal orthopedic prosthetic devices, implants and grafts, subsequent encounter: Secondary | ICD-10-CM | POA: Insufficient documentation

## 2021-12-29 DIAGNOSIS — L0390 Cellulitis, unspecified: Secondary | ICD-10-CM | POA: Insufficient documentation

## 2021-12-29 DIAGNOSIS — Z9189 Other specified personal risk factors, not elsewhere classified: Secondary | ICD-10-CM | POA: Insufficient documentation

## 2021-12-29 DIAGNOSIS — G8918 Other acute postprocedural pain: Secondary | ICD-10-CM | POA: Insufficient documentation

## 2021-12-29 DIAGNOSIS — Z720 Tobacco use: Secondary | ICD-10-CM | POA: Insufficient documentation

## 2021-12-29 DIAGNOSIS — Z7689 Persons encountering health services in other specified circumstances: Secondary | ICD-10-CM | POA: Insufficient documentation
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
## 2021-12-29 NOTE — Telephone Encounter (Addendum)
Misty Stanley from [facility] called to get specifics about what wound care orders are needed for the patient. She saw that pt had a wound vac and wanted to know if pt still had it on    Lisa left her number for nursing to call back at [phone]

## 2021-12-29 NOTE — Telephone Encounter (Signed)
Team APP discussed with writer need for contact to home care to ensure that wound care orders requiring signatures are sent to [facility]. Left message for Selena Batten, RN at [facility] to return call to [facility]; provided call back number.

## 2021-12-29 NOTE — Telephone Encounter (Signed)
Spoke to Romney from  [facility]. Requested that any orders needed for care of patient's wound at home are sent to [facility] for PCP signatures. Misty Stanley verbalized understanding; stated that patient's first home care visit is scheduled for tomorrow, 12/30/21.

## 2021-12-29 NOTE — Progress Notes (Signed)
12/29/21 1540   OPAT - Sign On Note   Start Date 12/28/21   ABX ID [id number]   Start Location [facility]   Diagnosis Osteomyelitis   Organisms Enterococcus   Antibiotics Ertapenem   Infusion [facility]   Nursing Agencies [facility]   ID Fellows/APPs Jerrye Noble, NP   IV Access PICC   Labs CBC with diff;CMP;CRP   Anticipated therapy end date 01/31/22

## 2021-12-30 ENCOUNTER — Ambulatory Visit: Payer: Self-pay | Admitting: Orthopedic Surgery

## 2021-12-30 ENCOUNTER — Telehealth: Payer: Self-pay | Admitting: Internal Medicine

## 2021-12-30 ENCOUNTER — Non-Acute Institutional Stay: Payer: MEDICAID

## 2021-12-30 ENCOUNTER — Ambulatory Visit: Payer: MEDICAID | Admitting: Infectious Diseases

## 2021-12-30 NOTE — Telephone Encounter (Signed)
Copied from CRM #[id number]. Topic: Access to Care - Speak to Provider/Office Staff  >> Dec 30, 2021  3:18 PM Reinaldo Raddle wrote:  [facility] is reporting they did start of care today with patient and orders to follow, no dressing change to right lower extremity didn't see anything in chart notes to change.Michelle contact [phone]

## 2021-12-30 NOTE — Discharge Summary (Signed)
Name: [name] MRN: [id number] DOB: 01/02/1986     Admit Date: 12/19/2021   Date of Discharge: 12/28/2021     Patient was accepted for discharge to   Home or Self Care [1]           Discharge Attending Physician: Jerolyn Center, MD      Hospitalization Summary    CONCISE NARRATIVE: Presented to the Emergency Department for definitive treatment of a right tibial abscess.   Taken to the Operating Room for below listed surgery.  Tolerated the procedure well and admitted to [id number]. ID consulted for abx recommendations and ultimately recommended Ertapenem, 1g q24h  until 01/31/22. Lovenox initiated for DVT prophylaxis.  Pain eventually controlled on oral medications.  Voiding without difficulty and tolerating regular diet.  Maintained weight bearing as tolerated on RLE.  Cleared by Physical Therapy/Occupational Therapy.  Ready for discharge on POD # 8.            OR PROCEDURE: S/p RLE removal of hardware, I&D 12/20/21                  SIGNIFICANT MED CHANGES: Yes  DVT ppx: Lovenox   Abx: Ertapenem, 1g q24h  until 01/31/22   CONSULTANT SERVICE     Infectious Disease               Signed: Candelaria Stagers, PA  On: 12/30/2021  at: 11:06 AM

## 2021-12-31 ENCOUNTER — Ambulatory Visit: Payer: MEDICAID | Admitting: Orthopedic Surgery

## 2021-12-31 ENCOUNTER — Other Ambulatory Visit: Payer: Self-pay

## 2021-12-31 ENCOUNTER — Telehealth: Payer: Self-pay

## 2021-12-31 ENCOUNTER — Encounter: Payer: Self-pay | Admitting: Orthopedic Surgery

## 2021-12-31 VITALS — Ht 71.0 in | Wt 290.0 lb

## 2021-12-31 DIAGNOSIS — T847XXD Infection and inflammatory reaction due to other internal orthopedic prosthetic devices, implants and grafts, subsequent encounter: Secondary | ICD-10-CM

## 2021-12-31 NOTE — Progress Notes (Unsigned)
~~~~~~~  ~~  PATIENTTAHIR, [name]  MR #:  [id number]   ~~~CSN:~  [id number] DOB:  1985/03/14   ~~~DICTATED BY:  Patrick Areola, PA ~DATE OF VISIT:  12/31/2021   ~~~~  DIAGNOSIS:  Status post lower extremity I and D and hardware removal.~  ~  FOLLOWUP:  In 10 days.~  ~  X-RAYS UPON RETURN:  None.~  ~  HISTORY OF PRESENT ILLNESS:  The patient is here today for evaluation of the aforementioned.  He has been weightbearing the boot,  still has some pain with ambulation. He has had a prevena vac in place.  ~  PHYSICAL EXAMINATION:  GENERAL:  No acute distress.  Normal mood and affect.  EXTREMITIES:  Left lower extremity reveals intact skin.  No erythema or ecchymosis.  Incisions are healing with sutures in place.  There is no erythema or drainage.~  ~  DIAGNOSTIC IMAGING:  None.~  ~  ASSESSMENT AND PLAN:  At this time, patient's sock was removed.  Dry dressings were applied and sutures were left in place.  We will see him back in 10 days for repeat skin check and likely suture removal.~  ~  The patient was seen and examined with Dr. Drinda Butts, who is in agreement with the assessment and plan.~  ~  Addendum: I saw and evaluated the patient with the advanced practice provider.I agree with the above history and examination findings and plan of care as documented above. I have discussed findings with the patient. Appropriate edits to the note have been made.     Jerolyn Center, MD 12:33 PM 01/03/2022  ~  ~  ~  ~ Dictated By:  Patrick Areola, PA~  ~  ~  ______________________________~  Lamar Sprinkles, MD~  ~  SLB/MODL~  DD:  12/31/2021 14:37:09~  DT:  12/31/2021 16:00:03~  Job #:  [id number]~  ~  ~~cc:  ~      ~~~~  ~~

## 2021-12-31 NOTE — Telephone Encounter (Signed)
[facility] provider is requesting return phone call for wound care orders.

## 2022-01-02 ENCOUNTER — Other Ambulatory Visit: Payer: Self-pay

## 2022-01-03 ENCOUNTER — Other Ambulatory Visit: Payer: Self-pay

## 2022-01-03 ENCOUNTER — Other Ambulatory Visit: Payer: MEDICAID

## 2022-01-03 ENCOUNTER — Other Ambulatory Visit: Payer: Self-pay | Admitting: Orthopedic Surgery

## 2022-01-03 ENCOUNTER — Other Ambulatory Visit
Admission: RE | Admit: 2022-01-03 | Discharge: 2022-01-03 | Disposition: A | Discharge status: Home / Self Care | Payer: MEDICAID | Source: Ambulatory Visit | Attending: Internal Medicine | Admitting: Internal Medicine

## 2022-01-03 ENCOUNTER — Ambulatory Visit: Payer: Self-pay | Admitting: Orthopedic Surgery

## 2022-01-03 DIAGNOSIS — Z0189 Encounter for other specified special examinations: Secondary | ICD-10-CM | POA: Insufficient documentation

## 2022-01-03 LAB — CBC AND DIFFERENTIAL
Baso # K/uL: 0 10*3/uL (ref 0.0–0.2)
Basophil %: 0.3 %
Eos # K/uL: 0.1 10*3/uL (ref 0.0–0.5)
Eosinophil %: 1.5 %
Hematocrit: 43 % (ref 37–52)
Hemoglobin: 14.2 g/dL (ref 12.0–17.0)
IMM Granulocytes #: 0 10*3/uL (ref 0.0–0.0)
IMM Granulocytes: 0.3 %
Lymph # K/uL: 2.3 10*3/uL (ref 1.0–5.0)
Lymphocyte %: 32.3 %
MCH: 30 pg (ref 26–32)
MCHC: 33 g/dL (ref 32–37)
MCV: 89 fL (ref 75–100)
Mono # K/uL: 0.4 10*3/uL (ref 0.1–1.0)
Monocyte %: 5.8 %
Neut # K/uL: 4.3 10*3/uL (ref 1.5–6.5)
Nucl RBC # K/uL: 0 10*3/uL (ref 0.0–0.0)
Nucl RBC %: 0 /100 WBC (ref 0.0–0.2)
Platelets: 406 10*3/uL (ref 150–450)
RBC: 4.8 MIL/uL (ref 4.0–6.0)
RDW: 13.9 % (ref 0.0–15.0)
Seg Neut %: 59.8 %
WBC: 7.2 10*3/uL (ref 3.5–11.0)

## 2022-01-03 LAB — COMPREHENSIVE METABOLIC PANEL
ALT: 109 U/L — ABNORMAL HIGH (ref 0–50)
AST: 46 U/L (ref 0–50)
Albumin: 4.2 g/dL (ref 3.5–5.2)
Alk Phos: 185 U/L — ABNORMAL HIGH (ref 40–130)
Anion Gap: 12 (ref 7–16)
Bilirubin,Total: <0.2 mg/dL (ref 0.0–1.2)
CO2: 23 mmol/L (ref 20–28)
Calcium: 9.2 mg/dL (ref 9.0–10.3)
Chloride: 106 mmol/L (ref 96–108)
Creatinine: 0.72 mg/dL (ref 0.67–1.17)
Glucose: 59 mg/dL — ABNORMAL LOW (ref 60–99)
Lab: 10 mg/dL (ref 6–20)
Potassium: 4.9 mmol/L (ref 3.3–5.1)
Sodium: 141 mmol/L (ref 133–145)
Total Protein: 7.5 g/dL (ref 6.3–7.7)
eGFR BY CREAT: 122 *

## 2022-01-03 LAB — CRP: CRP: 14 mg/L — ABNORMAL HIGH (ref 0–8)

## 2022-01-03 LAB — ANAEROBIC CULTURE
Anaerobic Culture: 0
Anaerobic Culture: 0

## 2022-01-03 LAB — SEDIMENTATION RATE, AUTOMATED: Sedimentation Rate: 54 mm/hr — ABNORMAL HIGH (ref 0–15)

## 2022-01-03 NOTE — Nursing Note (Signed)
Assessment findings/ Changes from previous visit: Patient answered door for vn. Patient Axox4, VSS, afebrile. Single lumen PICC in RUE, dressing changed 12/28/21. VN reviewed antibiotic administration with patient, patient with good teachback. Patient has prevena wound vac on RLE, no wound care done this visit, patient seeing orthopedic MD tomorrow.Marland [facility] folder reviewed with patient. VN reviewed s/s of infection and increasing protein to aide in wound healing, patient verbalized understanding.   Skilled Need/Focus of Care:nursing for IV care/labs, teach wound care and review signs of infection     Caregiver availability and willingness: no cg present  Discharge plan: anticipate d/c after IV course completed and PICC pulled  Plan for next visit PICC assessment, labs, wound assessment       VN calleed Dr. Geryl Rankins office 01/09/22 at 2:46 pm to inform of [facility], spoke with Metro Kung

## 2022-01-03 NOTE — Telephone Encounter (Signed)
Hi [name] needs a refill on his Tylenol and his Oxy. [facility].    Thanks!  Katie

## 2022-01-04 ENCOUNTER — Ambulatory Visit: Payer: Self-pay | Admitting: Orthopedic Surgery

## 2022-01-04 ENCOUNTER — Other Ambulatory Visit: Payer: Self-pay

## 2022-01-04 NOTE — Nursing Note (Signed)
Assessment findings/ Changes from previous visit: Patient answered door for vn. PICC site benign, patent with good blood return. Labs drawn. PICC dressing changed per protocol. Incisions assessed, wound vac d/c'd a patient's ortho appt. No s/s of infection noted. VN reviewed s/s of infection with patient, patient with good teachback. PICs done of incisions, vn did not have tape measure, will measure at next visit. VSS, afebrile.  Skilled Need/Focus of Care: PICC assessment, incision assessment  Caregiver availability and willingness: no cg present  Discharge plan: when patient no longer has PICC needs  Plan for next visit: Incision assessment/care

## 2022-01-05 ENCOUNTER — Telehealth: Payer: Self-pay

## 2022-01-05 ENCOUNTER — Other Ambulatory Visit: Payer: Self-pay

## 2022-01-05 ENCOUNTER — Other Ambulatory Visit: Payer: MEDICAID

## 2022-01-05 ENCOUNTER — Other Ambulatory Visit: Payer: Self-pay | Admitting: Internal Medicine

## 2022-01-05 MED ORDER — OXYCODONE HCL 5 MG PO TABS *I*
5.0000 mg | ORAL_TABLET | Freq: Four times a day (QID) | ORAL | 0 refills | Status: DC | PRN
Start: 2022-01-05 — End: 2022-01-05

## 2022-01-05 MED ORDER — ACETAMINOPHEN 500 MG PO TABS *I*
1000.0000 mg | ORAL_TABLET | Freq: Three times a day (TID) | ORAL | 0 refills | Status: DC
Start: 2022-01-05 — End: 2022-01-05

## 2022-01-05 MED ORDER — OXYCODONE HCL 5 MG PO TABS *I*
5.0000 mg | ORAL_TABLET | Freq: Four times a day (QID) | ORAL | 0 refills | Status: AC | PRN
Start: 2022-01-05 — End: ?

## 2022-01-05 MED ORDER — ACETAMINOPHEN 500 MG PO TABS *I*
1000.0000 mg | ORAL_TABLET | Freq: Three times a day (TID) | ORAL | 0 refills | Status: AC
Start: 2022-01-05 — End: ?

## 2022-01-05 NOTE — Addendum Note (Signed)
Addended by: Joanna Hews on: 01/05/2022 11:47 AM     Modules accepted: Orders

## 2022-01-05 NOTE — Telephone Encounter (Signed)
Patient is requesting refills on his Tylenol and Oxycodone medication.

## 2022-01-09 ENCOUNTER — Other Ambulatory Visit: Payer: Self-pay

## 2022-01-10 ENCOUNTER — Other Ambulatory Visit: Payer: Self-pay

## 2022-01-10 ENCOUNTER — Other Ambulatory Visit
Admission: RE | Admit: 2022-01-10 | Discharge: 2022-01-10 | Disposition: A | Discharge status: Home / Self Care | Payer: MEDICAID | Source: Ambulatory Visit | Attending: Internal Medicine | Admitting: Internal Medicine

## 2022-01-10 ENCOUNTER — Encounter: Payer: Self-pay | Admitting: Orthopedic Surgery

## 2022-01-10 ENCOUNTER — Other Ambulatory Visit: Payer: MEDICAID

## 2022-01-10 ENCOUNTER — Ambulatory Visit: Payer: MEDICAID | Admitting: Orthopedic Surgery

## 2022-01-10 VITALS — BP 144/67 | HR 81 | Ht 71.0 in | Wt 290.0 lb

## 2022-01-10 DIAGNOSIS — Z0189 Encounter for other specified special examinations: Secondary | ICD-10-CM | POA: Insufficient documentation

## 2022-01-10 DIAGNOSIS — T847XXD Infection and inflammatory reaction due to other internal orthopedic prosthetic devices, implants and grafts, subsequent encounter: Secondary | ICD-10-CM

## 2022-01-10 LAB — COMPREHENSIVE METABOLIC PANEL
ALT: 62 U/L — ABNORMAL HIGH (ref 0–50)
AST: 24 U/L (ref 0–50)
Albumin: 4.3 g/dL (ref 3.5–5.2)
Alk Phos: 171 U/L — ABNORMAL HIGH (ref 40–130)
Anion Gap: 14 (ref 7–16)
Bilirubin,Total: 0.3 mg/dL (ref 0.0–1.2)
CO2: 22 mmol/L (ref 20–28)
Calcium: 9.1 mg/dL (ref 9.0–10.3)
Chloride: 106 mmol/L (ref 96–108)
Creatinine: 0.76 mg/dL (ref 0.67–1.17)
Glucose: 66 mg/dL (ref 60–99)
Lab: 16 mg/dL (ref 6–20)
Potassium: 5 mmol/L (ref 3.3–5.1)
Sodium: 142 mmol/L (ref 133–145)
Total Protein: 7.3 g/dL (ref 6.3–7.7)
eGFR BY CREAT: 120 *

## 2022-01-10 LAB — CBC AND DIFFERENTIAL
Baso # K/uL: 0 10*3/uL (ref 0.0–0.2)
Basophil %: 0.3 %
Eos # K/uL: 0.1 10*3/uL (ref 0.0–0.5)
Eosinophil %: 1.8 %
Hematocrit: 42 % (ref 37–52)
Hemoglobin: 14 g/dL (ref 12.0–17.0)
IMM Granulocytes #: 0 10*3/uL (ref 0.0–0.0)
IMM Granulocytes: 0.2 %
Lymph # K/uL: 2.5 10*3/uL (ref 1.0–5.0)
Lymphocyte %: 38.2 %
MCH: 29 pg (ref 26–32)
MCHC: 33 g/dL (ref 32–37)
MCV: 88 fL (ref 75–100)
Mono # K/uL: 0.5 10*3/uL (ref 0.1–1.0)
Monocyte %: 8 %
Neut # K/uL: 3.4 10*3/uL (ref 1.5–6.5)
Nucl RBC # K/uL: 0 10*3/uL (ref 0.0–0.0)
Nucl RBC %: 0 /100 WBC (ref 0.0–0.2)
Platelets: 354 10*3/uL (ref 150–450)
RBC: 4.8 MIL/uL (ref 4.0–6.0)
RDW: 14.5 % (ref 0.0–15.0)
Seg Neut %: 51.5 %
WBC: 6.6 10*3/uL (ref 3.5–11.0)

## 2022-01-10 LAB — EKG 12-LEAD
P: 19 deg
PR: 157 ms
QRS: -58 deg
QRSD: 93 ms
QT: 373 ms
QTc: 411 ms
Rate: 73 {beats}/min
T: 258 deg

## 2022-01-10 LAB — SEDIMENTATION RATE, AUTOMATED: Sedimentation Rate: 28 mm/hr — ABNORMAL HIGH (ref 0–15)

## 2022-01-10 LAB — GLUC COMMENT

## 2022-01-10 LAB — CRP: CRP: 8 mg/L (ref 0–8)

## 2022-01-10 MED ORDER — OXYCODONE HCL 10 MG PO TABS *I*
10.0000 mg | ORAL_TABLET | ORAL | 0 refills | Status: AC | PRN
Start: 2022-01-10 — End: ?
  Filled 2022-01-10: qty 40, 7d supply, fill #0

## 2022-01-10 NOTE — Progress Notes (Signed)
PATIENTANTONEO, [name]  MR #:  [id number]   CSN:  [id number] DOB:  1986-01-14   DICTATED BY:  Edwinna Areola, PA DATE OF VISIT:  01/10/2022     DIAGNOSIS:  Status post right lower extremity hardware removal with incision and drainage.    FOLLOWUP:  4 weeks.    X-RAYS UPON RETURN:  Tib-fib AP lateral views on the right-hand side.    HISTORY OF PRESENT ILLNESS:  The patient is here today for routine followup and skin check.  He has been weightbearing in his boot.    PHYSICAL EXAMINATION:  GENERAL:  No acute distress.  Normal mood and affect.  LOWER EXTREMITY: Exam reveals well-healed incisions with sutures in place.  There is no erythema or drainage.    ASSESSMENT AND PLAN:  At this time, patient's sutures were removed.  He may continue weightbearing as tolerated.  May transition to normal shoe wear as able, and I would have him follow up in the office in 4 weeks at which time the patient will have been off his antibiotic regimen.  If there are any issues or concerns in the interim, would be happy to see him on an earlier basis.       Dictated By:  Edwinna Areola, PA      ______________________________  Lamar Sprinkles, MD    SLB/MODL  DD:  01/10/2022 09:40:28  DT:  01/10/2022 10:49:34  Job #:  [id number]    cc:

## 2022-01-10 NOTE — Telephone Encounter (Signed)
Refill request routed to Dr. Ciro Backer 01/10/2022 10:55 AM    Controlled Substance follow-up Assessment     Name of Medication: oxyCODONE (ROXICODONE)   Dose of Medication: 10 mg    Last Toxicology Screen with results:    Pain Clinic Profile:  No results for input(s): "AMPU", "BEU", "OPSU", "OXYU", "THCU", "BZDU", "COPS", "CTHC", "UCRNC" in the last 8760 hours.     Methadone Metabolite, Urine:  No results for input(s): "EDDPU" in the last 8760 hours.     Fentanyl, Urine:  No results for input(s): "FENT" in the last 8760 hours.     Tramadol and Metabolites:  No results for input(s): "TRAMD", "OTRMD", "NTRMD" in the last 8760 hours.     Missed appointments:  Recent No Show Appointments       Visit Type Date Time Department    NEW PATIENT VISIT 11/26/2021  9:40 AM [facility] [id number]            Last Clinic Appointment: 12/29/2021      Future Appointments   Date Time Provider Department Center   01/10/2022 To Be Determined Barbette Merino, RN [facility] None   01/13/2022 To Be Determined Barbette Merino, RN [facility] None   01/14/2022 10:00 AM Elnita Maxwell, MD [facility] None   01/17/2022 To Be Determined Barbette Merino, RN [facility] None   01/20/2022 To Be Determined Barbette Merino, RN [facility] None   01/24/2022 To Be Determined Barbette Merino, RN [facility] None   01/24/2022  2:00 PM Edyth Gunnels, NP [facility] None   01/27/2022 To Be Determined Barbette Merino, RN [facility] None   01/31/2022 To Be Determined Barbette Merino, RN [facility] None   02/03/2022 To Be Determined Barbette Merino, RN [facility] None   02/04/2022  9:30 AM Jeannie Done, MD [facility] None   02/07/2022 To Be Determined Barbette Merino, RN [facility] None   02/08/2022  8:40 AM Edwinna Areola, PA [facility] None   02/10/2022 To Be Determined Barbette Merino, RN [facility] None   02/14/2022 To Be Determined Barbette Merino, RN [facility] None   02/17/2022 To Be Determined Barbette Merino, RN [facility] None   02/21/2022 To Be Determined Barbette Merino, RN [facility] None   02/24/2022 To Be Determined Barbette Merino, RN [facility]  None       I-STOP    Confidential Drug Report  Search Terms: Ellery Wider, 1985/07/18  Search Date: 01/10/2022 10:56:36 AM  Searching on behalf of: [id number] - Raiford Noble  The Drug Utilization Report below displays all of the controlled substance prescriptions, if any, that your patient has filled in the last twelve months. The information displayed on this report is compiled from pharmacy submissions to the Department, and accurately reflects the information as submitted by the pharmacies.  This report was requested by: Shirlee Latch  Reference #: [id number]  Practitioner Count: 3  Pharmacy Count: 2  Current Opioid Prescriptions: 1  Current Benzodiazepine Prescriptions: 0  Current Stimulant Prescriptions: 0        Patient Demographic Information [facility])       [facility] First Name Last Name Birth Date Gender Street Address [place] Zip Code   Denym Middaugh 06-14-1985 Male [address] [place] [address]   Jeanpaul Pierotti [birth year] Male [address] [place] [address]        Search:  [facility] My Rx Current Rx Drug Type Rx Written Rx Dispensed Drug Quantity Days Supply Prescriber Name  Prescriber DEA # Payment Method Dispenser   B Marc Morgans 01/05/2022 01/08/2022 oxycodone hcl (ir) 5 mg tablet 20 5 Edwinna Areola [place] [id number] Medicaid [facility] [phone]   A U N O 12/28/2021 12/28/2021 oxycodone hcl (ir) 10 mg tab 40 7 Candelaria Stagers [id number] Other [facility]   A U Alvie Heidelberg 10/22/2021 10/22/2021 oxycodone hcl (ir) 5 mg tablet 6 1 Lynnae January, Shela Commons, (MD) [id number] Other [facility]

## 2022-01-10 NOTE — Procedures (Signed)
Suture removal    Date/Time: 01/10/2022 8:20 AM    Performed by: Pennie Banter, RN  Authorized by: Edwinna Areola, PA    Universal protocol:     Procedure explained and questions answered to patient or proxy's satisfaction: yes    Location:     Location:  Lower extremity    Lower extremity location:  Leg    Leg location:  R lower leg  Procedure details:     Wound appearance:  Clean, pink, nontender and non purulent    Sutures removed?:  yes    Staples removed?:  no  Post-procedure details:     Post-removal:  No dressing applied    Patient tolerance of procedure:  Tolerated well, no immediate complications  Comments:      Several sutures removed from multiple incisions in right lower leg.

## 2022-01-11 ENCOUNTER — Other Ambulatory Visit: Payer: Self-pay

## 2022-01-12 ENCOUNTER — Other Ambulatory Visit: Payer: Self-pay

## 2022-01-12 LAB — FUNGUS CULTURE
Fungal Culture: 0
Fungal Culture: 0
Fungal Culture: 0
Fungal Culture: 0
Fungal Culture: 0
Fungal Culture: 0

## 2022-01-13 ENCOUNTER — Other Ambulatory Visit: Payer: Self-pay

## 2022-01-13 ENCOUNTER — Other Ambulatory Visit: Payer: MEDICAID

## 2022-01-13 NOTE — Op Note (Unsigned)
[name], [name]  MR #:  [id number]   CSN:  [id number] DOB:  March 25, 1985    AGE:  36     SURGEON:  Lamar Sprinkles, MD  CO-SURGEON:    ASSISTANT:  Dr. Dorris Carnes and Dr. Joanna Puff.  SURGERY DATE:  12/20/2021    PREOPERATIVE DIAGNOSIS:    Right tibial shaft osteomyelitis.  Right proximal tibial plateau osteomyelitis.  Right septic knee arthritis.  Right anterior compartment lower leg abscess.    POSTOPERATIVE DIAGNOSIS:    Right tibial shaft osteomyelitis.  Right proximal tibial plateau osteomyelitis.  Right septic knee arthritis.  Right anterior compartment lower leg abscess.    OPERATIVE PROCEDURE:    Debridement with curettage of right tibial shaft, CPT [id number].  Debridement of right proximal tibial plateau and excision of heterotopic proximal tibial bone, CPT 47829.  Right knee arthrotomy with irrigation and debridement, CPT 27310.  Evacuation and debridement of right lower leg abscess, CPT [phone].    ANESTHESIA:  General.    ESTIMATED BLOOD LOSS:  100 cc.    FLUIDS:  Intravenous crystalloid.    OUTPUTS:  None.    COMPLICATIONS:  None.    INDICATION FOR PROCEDURE:  The patient is 36 year old gentleman who had a previous fracture fixation of his tibial plateau as well as intramedullary nailing of his tibia.  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  The patient was felt to benefit from hardware removal and debridement of his bone with bone sampling and debridement of his knee.    DESCRIPTION OF PROCEDURE:  The patient was seen in the preanesthesia area where the right lower extremity was determined to be the correct operative site.  The patient was brought back to the operating room, placed supine on the operating table.  He was administered general anesthesia by the anesthesiologist team.  A tourniquet was placed high on his right thigh, and his right lower extremity was then prepped and draped in  usual sterile fashion.    Attention was turned first to the proximal tibia.  A lateral incision over the plateau was used.  Sharp dissection was carried down through the skin, blunt dissection was carried down through the subcutaneous tissues.  Dissection was carried down to the plate.  At this point, the screws were removed intact without complication, and upon removal of the screws, there was a purulence that was noted from the proximal tibia.  The plate was then elevated, and at this point thorough debridement and curettage was performed of the screw holes and the proximal tibia bone.  Samples were sent for culture.  Adequate debridement and irrigation was performed.    Attention was then turned distally over the lower leg where the patient has a draining fistula, and at this point, a linear incision was created using the patient's previous incision.  The fistula area was ellipsed out and carried down, which went directly down to the hardware which was seen over the anterior tibia.  At this point, the mini fragment plate was removed.  The screws were removed intact without complications.  The plate was elevated.  Again, there was purulence noted from the bone, and at this point, the tibial shaft was thoroughly debrided and irrigated.  Curettage was performed through the screw holes and of the bone itself, and samples from the tibial shaft bone were also sent.    Attention  was then turned distally.  A linear incision was created over the patient's distal interlocking screws over his tibial nail.  Sharp dissection was carried down through the skin.  Blunt dissection was carried down through the subcutaneous tissues.  At this point, there was a noted purulence over the hardware.  The screws were removed intact without complications.    Attention was then turned back to the anterior mid shin incision.  At this point, through the anterior compartment, it was noted that the patient did have abscess.  This was debrided  and copiously irrigated, and the muscle was debrided with the use of sharp and scissor dissection and the area was copiously irrigated with normal saline.    Attention was turned proximally and a similarly sharp dissection was carried down through the skin.  Blunt dissection was carried down through the subcutaneous tissues using the patient's old incision down to the proximal interlocking screws through the nail.  These were removed intact without complication and there was again purulence noted upon removal of the screws.  This area was copiously irrigated and debrided and curetted.    Attention was then turned anteriorly to the knee.  A linear incision made over the knee.  Sharp dissection was carried down through the skin.  Blunt dissection was carried down through the subcutaneous tissues.  Dissection was carried down and a medial parapatellar approach was taken to the knee joint itself.  At this point, the knee joint was copiously irrigated and thorough synovectomy was performed.  Adequate debridement of the knee joint was then performed through the lower part of the incision.  Attention was then turned to the proximal tibia.  There was heterotopic bone overlying the nail.  At this point, the guide pin was then placed through this and this was over-reamed, and using the nail extraction device, the nail was then removed from the tibia.    At this point, the proximal tibia was again copiously irrigated and curetted out through that area.  Samples were sent for culture and copious irrigation was performed.  Powdered antibiotics were placed through the entirety of the wounds.  The deep tissue was closed with #1 Vicryl suture, subcutaneous tissue was closed with 2-0 Vicryl suture, and the skin was closed with interrupted 2-0 nylon stitches.  Xeroform and a dry sterile dressing were placed over the wound.  The patient had a Prevena VAC placed over his lower leg incision that had been draining.  He was extubated and  taken to the postanesthesia care unit in stable condition.                  ______________________________  Lamar Sprinkles, MD    JPK/MODL  DD:  01/13/2022 08:00:25  DT:  01/13/2022 11:01:20  Job #:  [id number]    cc:

## 2022-01-13 NOTE — Nursing Note (Signed)
Assessment findings/ Changes from previous visit:  Patient answered door for vn.Patient states he is doing well. Sutures were removed at MD appt. PICC line benign, flushing well with good blood return. Labs drawn and dropped off at [facility] at [facility]. PICC dressing changed per protocol. VSS, afebrile. VN to assess incision at next visit.  Skilled Need/Focus of Care: PICC assessment, PICC dressing, labs  Caregiver availability and willingness: no cg present  Discharge plan: when patient no longer has PICC needs  Plan for next visit: PICC assessment, PICC dressing., Labs, incision assessment

## 2022-01-14 ENCOUNTER — Other Ambulatory Visit: Payer: Self-pay

## 2022-01-14 ENCOUNTER — Ambulatory Visit: Payer: Medicaid Other

## 2022-01-14 VITALS — BP 128/76 | HR 87 | Temp 97.1°F | Ht 71.0 in | Wt 288.0 lb

## 2022-01-14 DIAGNOSIS — Z139 Encounter for screening, unspecified: Secondary | ICD-10-CM

## 2022-01-14 DIAGNOSIS — T847XXD Infection and inflammatory reaction due to other internal orthopedic prosthetic devices, implants and grafts, subsequent encounter: Secondary | ICD-10-CM

## 2022-01-14 DIAGNOSIS — L0390 Cellulitis, unspecified: Secondary | ICD-10-CM

## 2022-01-14 DIAGNOSIS — G8918 Other acute postprocedural pain: Secondary | ICD-10-CM

## 2022-01-14 NOTE — Nursing Note (Signed)
VN and patient scheduled visit for Monday 12/4 at last visit
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
## 2022-01-14 NOTE — Addendum Note (Signed)
Addended byRaiford Noble on: 01/14/2022 11:43 AM     Modules accepted: Level of Service

## 2022-01-15 ENCOUNTER — Other Ambulatory Visit: Payer: Self-pay

## 2022-01-16 ENCOUNTER — Other Ambulatory Visit: Payer: Self-pay

## 2022-01-17 ENCOUNTER — Other Ambulatory Visit
Admission: RE | Admit: 2022-01-17 | Discharge: 2022-01-17 | Disposition: A | Discharge status: Home / Self Care | Payer: Medicaid Other | Source: Ambulatory Visit | Attending: Infectious Diseases | Admitting: Infectious Diseases

## 2022-01-17 ENCOUNTER — Other Ambulatory Visit: Payer: MEDICAID

## 2022-01-17 ENCOUNTER — Other Ambulatory Visit: Payer: Self-pay

## 2022-01-17 DIAGNOSIS — X58XXXD Exposure to other specified factors, subsequent encounter: Secondary | ICD-10-CM | POA: Insufficient documentation

## 2022-01-17 DIAGNOSIS — T847XXD Infection and inflammatory reaction due to other internal orthopedic prosthetic devices, implants and grafts, subsequent encounter: Secondary | ICD-10-CM | POA: Insufficient documentation

## 2022-01-17 LAB — CBC AND DIFFERENTIAL
Baso # K/uL: 0 10*3/uL (ref 0.0–0.2)
Basophil %: 0.4 %
Eos # K/uL: 0.1 10*3/uL (ref 0.0–0.5)
Eosinophil %: 2.4 %
Hematocrit: 41 % (ref 37–52)
Hemoglobin: 13.6 g/dL (ref 12.0–17.0)
IMM Granulocytes #: 0 10*3/uL (ref 0.0–0.0)
IMM Granulocytes: 0.4 %
Lymph # K/uL: 2 10*3/uL (ref 1.0–5.0)
Lymphocyte %: 40 %
MCH: 30 pg (ref 26–32)
MCHC: 33 g/dL (ref 32–37)
MCV: 89 fL (ref 75–100)
Mono # K/uL: 0.3 10*3/uL (ref 0.1–1.0)
Monocyte %: 6.7 %
Neut # K/uL: 2.5 10*3/uL (ref 1.5–6.5)
Nucl RBC # K/uL: 0 10*3/uL (ref 0.0–0.0)
Nucl RBC %: 0 /100 WBC (ref 0.0–0.2)
Platelets: 268 10*3/uL (ref 150–450)
RBC: 4.6 MIL/uL (ref 4.0–6.0)
RDW: 14.9 % (ref 0.0–15.0)
Seg Neut %: 50.1 %
WBC: 4.9 10*3/uL (ref 3.5–11.0)

## 2022-01-17 LAB — LIPID PANEL
Chol/HDL Ratio: 3.7
Cholesterol: 212 mg/dL — AB
HDL: 57 mg/dL (ref 40–60)
LDL Calculated: 134 mg/dL — AB
Non HDL Cholesterol: 155 mg/dL
Triglycerides: 105 mg/dL

## 2022-01-17 LAB — COMPREHENSIVE METABOLIC PANEL
ALT: 83 U/L — ABNORMAL HIGH (ref 0–50)
AST: 46 U/L (ref 0–50)
Albumin: 4.1 g/dL (ref 3.5–5.2)
Alk Phos: 162 U/L — ABNORMAL HIGH (ref 40–130)
Anion Gap: 12 (ref 7–16)
Bilirubin,Total: 0.2 mg/dL (ref 0.0–1.2)
CO2: 23 mmol/L (ref 20–28)
Calcium: 9.3 mg/dL (ref 9.0–10.3)
Chloride: 107 mmol/L (ref 96–108)
Creatinine: 0.7 mg/dL (ref 0.67–1.17)
Glucose: 77 mg/dL (ref 60–99)
Lab: 9 mg/dL (ref 6–20)
Potassium: 4.3 mmol/L (ref 3.3–5.1)
Sodium: 142 mmol/L (ref 133–145)
Total Protein: 6.7 g/dL (ref 6.3–7.7)
eGFR BY CREAT: 123 *

## 2022-01-17 LAB — CRP: CRP: 5 mg/L (ref 0–8)

## 2022-01-17 LAB — LDL CHOLESTEROL, DIRECT: LDL Direct: 140 mg/dL — AB

## 2022-01-17 LAB — SEDIMENTATION RATE, AUTOMATED: Sedimentation Rate: 19 mm/hr — ABNORMAL HIGH (ref 0–15)

## 2022-01-18 LAB — HEMOGLOBIN A1C: Hemoglobin A1C: 5.3 %

## 2022-01-18 NOTE — Nursing Note (Signed)
Assessment findings/ Changes from previous visit: Patient answered door for vn. PICC site benign, patent with good blodd return. Labs drawn. PICC dressng changed per protocol, cap and extension tubing changed. Incisions assessed, healing well. No s/s of infection. VSS, afebrile, denies pain, SOB, dizziness, N/V.  Skilled Need/Focus of Care: PICC assessment, PICC dressing, Labs  Caregiver availability and willingness: no cg present  Discharge plan: when patient no longer has PICC needs  Plan for next visit: PICC assessment, dressing, labs

## 2022-01-20 ENCOUNTER — Other Ambulatory Visit: Payer: MEDICAID

## 2022-01-20 ENCOUNTER — Telehealth: Payer: Self-pay | Admitting: Infectious Diseases

## 2022-01-20 NOTE — Telephone Encounter (Signed)
Discussed with [facility] that Pt has already been spoken to by the infusion pharmacy about his IV antibiotics.

## 2022-01-20 NOTE — Telephone Encounter (Signed)
Copied from CRM #[id number]. Topic: Medications/Prescriptions - Refill Request  >> Jan 20, 2022 10:50 AM [name] wrote:  , Patient, calling to request prescription(s) PICC LINE Medication  to be sent to the following Pharmacy [facility], [place] [address] .    Is patient out of the medication? yes    Patient's last visit? 11/16    Were the demographics and insurance verified?: Yes    Patient can be reached if necessary at .Telephone Information:  Mobile          [phone]    .  Marland Kitchen

## 2022-01-21 ENCOUNTER — Other Ambulatory Visit: Payer: Self-pay

## 2022-01-24 ENCOUNTER — Ambulatory Visit: Payer: Medicaid Other | Admitting: Infectious Diseases

## 2022-01-24 ENCOUNTER — Other Ambulatory Visit: Payer: MEDICAID

## 2022-01-24 ENCOUNTER — Other Ambulatory Visit
Admission: RE | Admit: 2022-01-24 | Discharge: 2022-01-24 | Disposition: A | Discharge status: Home / Self Care | Payer: Medicaid Other | Source: Ambulatory Visit | Attending: Internal Medicine | Admitting: Internal Medicine

## 2022-01-24 DIAGNOSIS — Z91199 Patient's noncompliance with other medical treatment and regimen due to unspecified reason: Secondary | ICD-10-CM

## 2022-01-24 DIAGNOSIS — Z0189 Encounter for other specified special examinations: Secondary | ICD-10-CM | POA: Insufficient documentation

## 2022-01-24 LAB — COMPREHENSIVE METABOLIC PANEL
ALT: 119 U/L — ABNORMAL HIGH (ref 0–50)
AST: 39 U/L (ref 0–50)
Albumin: 4.4 g/dL (ref 3.5–5.2)
Alk Phos: 182 U/L — ABNORMAL HIGH (ref 40–130)
Anion Gap: 12 (ref 7–16)
Bilirubin,Total: 0.3 mg/dL (ref 0.0–1.2)
CO2: 22 mmol/L (ref 20–28)
Calcium: 9.1 mg/dL (ref 9.0–10.3)
Chloride: 108 mmol/L (ref 96–108)
Creatinine: 0.76 mg/dL (ref 0.67–1.17)
Glucose: 87 mg/dL (ref 60–99)
Lab: 13 mg/dL (ref 6–20)
Potassium: 4.4 mmol/L (ref 3.3–5.1)
Sodium: 142 mmol/L (ref 133–145)
Total Protein: 7.3 g/dL (ref 6.3–7.7)
eGFR BY CREAT: 120 *

## 2022-01-24 LAB — CBC AND DIFFERENTIAL
Baso # K/uL: 0 10*3/uL (ref 0.0–0.2)
Basophil %: 0.3 %
Eos # K/uL: 0.2 10*3/uL (ref 0.0–0.5)
Eosinophil %: 2.5 %
Hematocrit: 44 % (ref 37–52)
Hemoglobin: 15.1 g/dL (ref 12.0–17.0)
IMM Granulocytes #: 0 10*3/uL (ref 0.0–0.0)
IMM Granulocytes: 0.5 %
Lymph # K/uL: 2.1 10*3/uL (ref 1.0–5.0)
Lymphocyte %: 32.9 %
MCH: 30 pg (ref 26–32)
MCHC: 34 g/dL (ref 32–37)
MCV: 89 fL (ref 75–100)
Mono # K/uL: 0.4 10*3/uL (ref 0.1–1.0)
Monocyte %: 5.8 %
Neut # K/uL: 3.7 10*3/uL (ref 1.5–6.5)
Nucl RBC # K/uL: 0 10*3/uL (ref 0.0–0.0)
Nucl RBC %: 0 /100 WBC (ref 0.0–0.2)
Platelets: 281 10*3/uL (ref 150–450)
RBC: 5 MIL/uL (ref 4.0–6.0)
RDW: 14.6 % (ref 0.0–15.0)
Seg Neut %: 58 %
WBC: 6.4 10*3/uL (ref 3.5–11.0)

## 2022-01-24 LAB — LIPID PANEL
Chol/HDL Ratio: 4.5
Cholesterol: 215 mg/dL — AB
HDL: 48 mg/dL (ref 40–60)
LDL Calculated: 123 mg/dL
Non HDL Cholesterol: 167 mg/dL
Triglycerides: 218 mg/dL — AB

## 2022-01-24 LAB — SEDIMENTATION RATE, AUTOMATED: Sedimentation Rate: 14 mm/hr (ref 0–15)

## 2022-01-24 LAB — LDL CHOLESTEROL, DIRECT: LDL Direct: 135 mg/dL — AB

## 2022-01-24 LAB — CRP: CRP: 7 mg/L (ref 0–8)

## 2022-01-24 NOTE — Progress Notes (Signed)
Patient was not on the video link at the appointment time. He logged on later in the day but I was unable to return to the link at that time.

## 2022-01-25 ENCOUNTER — Telehealth: Payer: Self-pay | Admitting: Internal Medicine

## 2022-01-25 LAB — HEMOGLOBIN A1C: Hemoglobin A1C: 5.2 %

## 2022-01-25 NOTE — Telephone Encounter (Unsigned)
Copied from CRM [id number]. Topic: Access to Care - Labs/Orders/Imaging  >> Jan 25, 2022 12:28 PM Uvaldo Bristle wrote:  Elnita Maxwell - [facility] Client Services     Calling to inform the office that the patients test results are back. Elnita Maxwell stated there is nothing critical to report and the office can find the results in the chart.     Call back: [phone]

## 2022-01-26 ENCOUNTER — Telehealth: Payer: Self-pay

## 2022-01-26 ENCOUNTER — Telehealth: Payer: Self-pay | Admitting: Radiology

## 2022-01-26 NOTE — Nursing Note (Signed)
Assessment findings/ Changes from previous visit: Patient answered door for vn. Patient doing well. PICC site benign, patent with good blood return. Labs drawn and dropped off at [facility]. PICC dressing changed per protocol. VSS, afebrile.  Skilled Need/Focus of Care: PICC assessment, dressing, labs  Caregiver availability and willingness: no cg present  Discharge plan: when patient no longer has PICC needs  Plan for next visit: PICC assessment, PICC dressing, labs

## 2022-01-26 NOTE — Telephone Encounter (Signed)
Patient would like to know if he needs to continue taking his Lovenox injections after his last on on 12.14.23

## 2022-01-26 NOTE — Telephone Encounter (Signed)
Patient is due to end IV antibiotics (Ertapenem) on 01/31/22.  Since he wasn't able to connect on video visit on 12/11, call to patient to see how he's feeling.      He states he is feeling well and progressing in healing.   He feels his incision is healing well, no redness or drainage  PICC line site looks good    He states he is tolerating ertapenem with exception of mild diarrhea (loose stools a couple times a day) no abdominal pain, no NV,  good appetite, eating and drinking well.     Denies chills or feverishness     Denies cough or shortness of breath.    Denies dysuria or hematuria.    Denies pain except sometimes at night he may have some R leg discomfort, takes his tylenol or oxy with good relief.  Overall denies myalgias or arthralgias  - sometimes Rankle hurts a little    Denies rashes   Denies dizziness or headaches.    Will discuss end of therapy date with with Dr Pennelope Bracken.

## 2022-01-27 ENCOUNTER — Other Ambulatory Visit: Payer: MEDICAID

## 2022-01-28 ENCOUNTER — Telehealth: Payer: Self-pay

## 2022-01-28 DIAGNOSIS — T847XXA Infection and inflammatory reaction due to other internal orthopedic prosthetic devices, implants and grafts, initial encounter: Secondary | ICD-10-CM

## 2022-01-28 NOTE — Telephone Encounter (Signed)
Per ID provider the Pt will finish IV antibiotics on 12/18 and then PICC line can be removed.    Notified [facility] that Pt is finished with IV therapy on 12/18 and PICC line will be removed by [facility]    Message left with Latoya, CTC with [facility], and stated that Pt will finish IV antibiotics on 12/18 and PICC line can be removed.    PICC line removal order faxed to [facility]

## 2022-01-31 ENCOUNTER — Other Ambulatory Visit: Payer: MEDICAID

## 2022-01-31 NOTE — Telephone Encounter (Signed)
Patient calling. States today is last day of IV antibiotics and line is being pulled by home care today. States would like call back to clarify as he usually takes his last dose of IV medication at 5PM. Will he not be taking this dose today? Can be reached at [phone]

## 2022-01-31 NOTE — Telephone Encounter (Addendum)
Spoke with [name] and confirmed that patient's end of therapy is today and PICC can be pulled today or tomorrow. (PICC removal order faxed).  Confirmed that labs do not have to be drawn.

## 2022-01-31 NOTE — Telephone Encounter (Signed)
Message left with Pt stating that the PICC line order was to have it removed tomorrow or Wednesday. Pt was told that if wants to dose earlier than 5pm he can and if he has his line pulled before his last dose then it will not effect his overall outcome of IV therapy.  Pt may call OPAT office if any questions

## 2022-02-01 ENCOUNTER — Other Ambulatory Visit: Payer: Medicaid Other

## 2022-02-01 NOTE — Progress Notes (Signed)
Home Infusion Service Therapy Completion      Patient [name] has ended Ertapenem therapy with the [facility] service as of 02/01/2022. Completion of therapy was without complications.   For questions please contact the pharmacy at [phone], 8:00 AM to 4:30 PM Monday-Friday or [phone] after hours.

## 2022-02-03 ENCOUNTER — Other Ambulatory Visit: Payer: MEDICAID

## 2022-02-03 ENCOUNTER — Other Ambulatory Visit: Payer: Self-pay

## 2022-02-03 NOTE — Nursing Note (Signed)
Assessment findings/ Changes from previous visit: Patient answered door for vn. Patient doing well. VSS, afebrile, denies pain, SOB, N/V/D, or falls. PICC line removed, line intact. Patient tolerated well. No further SN needed at this time. Patient in agreement to discharge.   Skilled Need/Focus of Care: PICC line removal  Caregiver availability and willingness: no cg present at visit  Discharge plan: d/c from homecare  Plan for next visit: NA

## 2022-02-04 ENCOUNTER — Other Ambulatory Visit: Payer: Self-pay

## 2022-02-04 ENCOUNTER — Other Ambulatory Visit: Payer: Self-pay | Admitting: Orthopedic Surgery

## 2022-02-04 ENCOUNTER — Ambulatory Visit: Payer: Medicaid Other | Attending: Internal Medicine | Admitting: Internal Medicine

## 2022-02-04 VITALS — BP 161/94 | HR 68 | Temp 95.9°F | Ht 71.0 in | Wt 293.0 lb

## 2022-02-04 DIAGNOSIS — S82291B Other fracture of shaft of right tibia, initial encounter for open fracture type I or II: Secondary | ICD-10-CM

## 2022-02-04 DIAGNOSIS — T847XXD Infection and inflammatory reaction due to other internal orthopedic prosthetic devices, implants and grafts, subsequent encounter: Secondary | ICD-10-CM

## 2022-02-04 DIAGNOSIS — R7989 Other specified abnormal findings of blood chemistry: Secondary | ICD-10-CM | POA: Insufficient documentation

## 2022-02-04 LAB — HEPATIC FUNCTION PANEL
ALT: 53 U/L — ABNORMAL HIGH (ref 0–50)
AST: 35 U/L (ref 0–50)
Albumin: 4.5 g/dL (ref 3.5–5.2)
Alk Phos: 184 U/L — ABNORMAL HIGH (ref 40–130)
Bilirubin,Direct: <0.2 mg/dL (ref 0.0–0.3)
Bilirubin,Total: 0.3 mg/dL (ref 0.0–1.2)
Total Protein: 7.2 g/dL (ref 6.3–7.7)
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
## 2022-02-04 NOTE — Progress Notes (Signed)
02/04/22 1419   Conclusion of Therapy   Date Therapy Completed 01/31/22   Transition to Oral Antibiotics No oral antibiotics needed   Follow up labs or imaging needed none   Vascular access status Picc line removed   Vascular access removed 02/01/22

## 2022-02-05 LAB — HEPATITIS B & C PROF
HBV Core Ab: POSITIVE
HBV S Ab Quant: 588.83 m[IU]/mL
HBV S Ab: POSITIVE
HBV S Ag: NEGATIVE
Hep C Ab: NEGATIVE

## 2022-02-05 LAB — SYPHILIS SCREEN
Syphilis Screen: NEGATIVE
Syphilis Status: NONREACTIVE

## 2022-02-05 LAB — HIV-1/2  ANTIGEN/ANTIBODY SCREEN WITH CONFIRMATION: HIV 1&2 ANTIGEN/ANTIBODY: NONREACTIVE

## 2022-02-07 ENCOUNTER — Other Ambulatory Visit: Payer: MEDICAID

## 2022-02-08 ENCOUNTER — Ambulatory Visit: Payer: Self-pay | Admitting: Orthopedic Surgery

## 2022-02-10 ENCOUNTER — Other Ambulatory Visit: Payer: MEDICAID

## 2022-02-14 ENCOUNTER — Other Ambulatory Visit: Payer: MEDICAID

## 2022-02-17 ENCOUNTER — Other Ambulatory Visit: Payer: MEDICAID

## 2022-02-21 ENCOUNTER — Other Ambulatory Visit: Payer: MEDICAID

## 2022-02-24 ENCOUNTER — Other Ambulatory Visit: Payer: MEDICAID

## 2022-05-08 NOTE — Progress Notes (Deleted)
[facility]  Periodic Preventive Exam    Chief Complaint: [name] is a 37 y.o. male who presents for annual exam    HPI:  ***    ***LFTs    ***fevers      Dental visits: ***  Vision: ***    Depression screen:        No data to display                  Health Behaviors:  Eating Habits: ***  Physical Activity: ***  Safety:  Motor vehicle/ sports safety discussed: ***  Working smoke/ CO detectors discussed: ***  Food/Housing/Medication insecurity discussed: ***  Firearm safety discussed: ***  Domestic Violence Screen: ***       reports that he has been smoking cigarettes. He has a 6.5 pack-year smoking history. He has never used smokeless tobacco. He reports current alcohol use of about 2.0 standard drinks of alcohol per week. He reports current drug use. Drug: Marijuana.   reports being sexually active and has had partner(s) who are male.    Allergies:  No Known Allergies (drug, envir, food or latex)  Medications:   Current Outpatient Medications   Medication Sig    oxyCODONE (ROXICODONE) 10 mg immediate release tablet Take 1 tablet (10 mg total) by mouth every 4 hours as needed  Max daily dose: 6 tablets (60 mg).    acetaminophen (TYLENOL) 500 mg tablet Take 2 tablets (1,000 mg total) by mouth every 8 hours    oxyCODONE (ROXICODONE) 5 mg immediate release tablet Take 1 tablet (5 mg total) by mouth every 6 hours as needed for Pain  Max daily dose: 20 mg    walker Rolling Walker for use with ambulation.    raised toilet seat Round raised toilet seat.     No current facility-administered medications for this visit.         has no past medical history on file.   has a past surgical history that includes orthopedic surgery (Right) and orthopedic surgery (Left).  family history includes Heart Disease in his maternal aunt and maternal grandmother.    Social Determinants of health:  Food Insecurity: No Food Insecurity (12/20/2021)    Hunger Vital Sign     Worried About Running Out of Food in the Last  Year: Never true     Ran Out of Food in the Last Year: Never true     Financial Resource Strain: Not on file         Patient's medications, allergies, past medical, surgical, social and family histories were reviewed, updated   ***        Review of Systems -   General ROS: denies significant weight change ***  Psychological ROS: denies depression anxiety ***  Ophthalmic ROS: no vision change ***  Respiratory ROS: denies cough/shortness of breath ***  Cardiovascular ROS: denies chest pain, palpitations ***  Gastrointestinal ROS: denies abdominal pain, nausea, vomiting, bowel habit changes ***  Genito-Urinary ROS: denies changes in urination ***  Musculoskeletal ROS: denies significant muscle or joint pain ***  Neurological ROS: denies headache, change in sensation ***  Dermatological ROS: denies skin changes ***      Exam:   There were no vitals filed for this visit.    General: NAD ***  HEENT: conjunctivae normal, MMM, neck supple without adenopathy  Cardiac: RRR no m/r/g  Resp: CTAB  Abd: S/ND/NT +BS  Ext: No LE edema  Neuro: AAOx3  Skin: No lesions/rashes  Laboratory data:   Reviewed ***    Assessment/plan :     ***  There are no diagnoses linked to this encounter.    Discussed healthier lifestyle practices including avoiding harmful behaviors, healthy diet and regular exercise, dental visits, vision screens, preventive health screening, immunizations.        Health Maintenance:  Health Maintenance: These screening recommendations are based on USPSTF, [employer], and [place] state guidelines   Topic Date Due    Pneumococcal Vaccination (1 of 2 - PCV) Never done    Flu Shot (1) Never done    COVID-19 Vaccine (1 - 2023-24 season) Never done    DTaP/Tdap/Td Vaccines (3 - Td or Tdap) 09/19/2031    HIV Screening  Completed    Hepatitis C Screening  Completed    HIB Vaccine  Aged Out    HPV Vaccine  Aged Out    Meningococcal Vaccine  Aged Out    Rotavirus Vaccine  Aged Out       CV Risks and Counseling  The  ASCVD Risk score (Arnett DK, et al., 2019) failed to calculate for the following reasons:    The 2019 ASCVD risk score is only valid for ages 65 to 47  - Aspirin: if ASCVD Risk >10% and age 19-59: ***  - Last Lipid (STatin for adults age 34-75 with no known CVD, 1+ CVD risks, and ASCVD >10%; possible 7.5-10%) : ***  - Physical activity recommendations 150 minutes moderate intensity or 75 minutes high intensity, if getting started recommend 10 minutes per day: ***  - Weight loss counseling (BMI>30): ***  - DM2 Screening (Adults who are overweight or obese):  ***    Cancer Screening and Counseling  - Colonoscopy: ***  - Lung Cancer (Grade B USPSTF. smoker/ex-smoker 50-80 yo, +20 pk-yrs, still smokes/quit <15 yrs ago: annually) - Last: *** Next Due: ***  - Skin cancer awareness and prevention discussed: ***    Cervical/Breast Cancer Screening:  - Pap Smear: ***  - Mammogram: ***    Testicular/Prostate Cancer and AAA Screening:  - Testicular self exam discussed: ***  - Prostate cancer screening discussed: ***  - AAA screening (men >65 who smoke): ***    Other Screenings and Counseling  - Dexa Scan (age 75 women, age 67 men, earlier for those with increased osteoporosis risk) - Next Due: ***   - Hep B (increased risk)   - Hep C Screening: ***  - Routine eye and dental care discussed: ***  - Counseled regarding health behaviors.***    Sexual Health and Counseling:  - Sexual Dysfunction Screening: ***  - STI risk counseling, safe sex, contraception discussed: ***  - STI Screening: ***  - HIV Screen: ***  - Syphilis Screening: ***  - PrEP for HIV ppx: ***    Immunizations  and Counseling:  - Influenza: ***  - Td/TdAP: UTD   - Pneumovax: ***   - Prevnar: ***  - Shingrix: ***  - HPV: ***  - Meningitis: ***  - Hep A: Last: *** Complete?: ***  - Hep B: Last: *** Complete?: ***    Advanced Directives and Counseling:  - Health care proxy discussed and given to patient.: deferred  - MOLST discussion: deferred    Elnita Maxwell, MD,  PGY-2, [facility] 05/08/2022, 2:15 PM

## 2022-05-09 ENCOUNTER — Ambulatory Visit: Payer: Self-pay

## 2022-05-10 ENCOUNTER — Encounter: Payer: Self-pay | Admitting: Internal Medicine

## 2022-10-04 IMAGING — DX DG KNEE COMPLETE 4+V*R*
5 series · 5 of 5 positions shown · non-contrast
Comparison: None.

CLINICAL DATA: Right knee pain after fall. History of tibial ORIF
approximately 6 months ago

EXAM:
RIGHT KNEE - COMPLETE 4+ VIEW

[knee ap (1 of 2)]
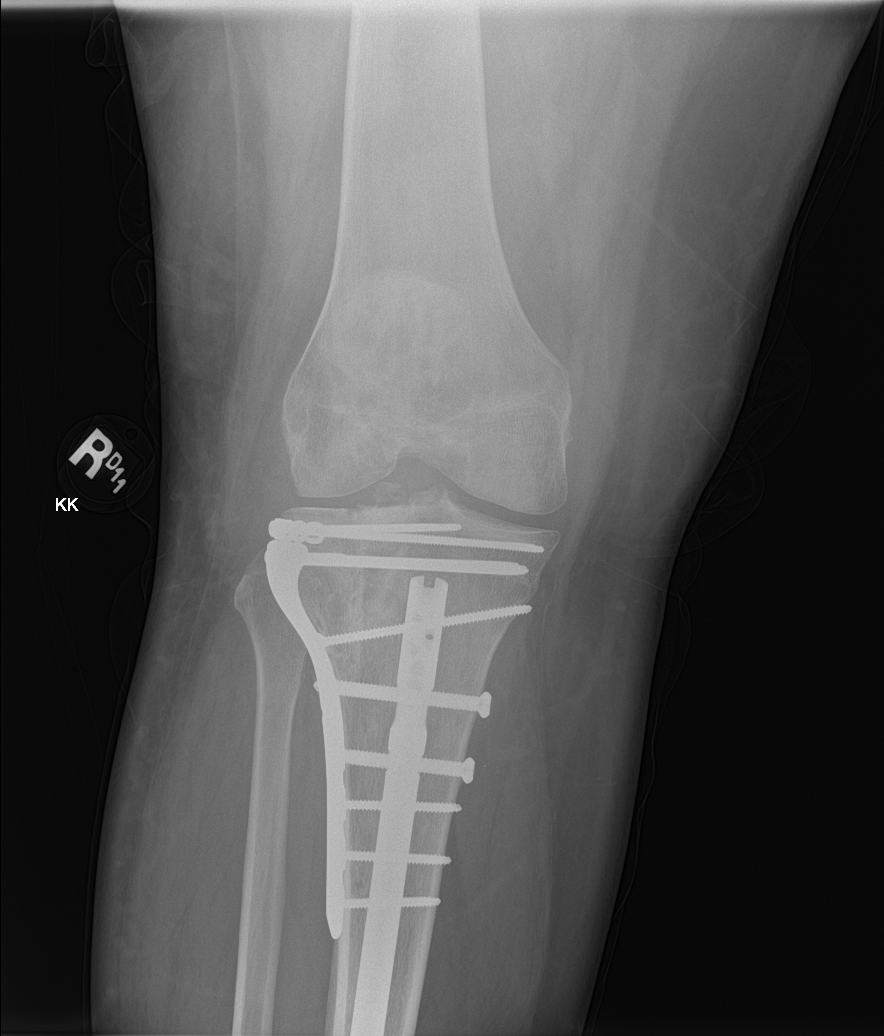

[knee lat]
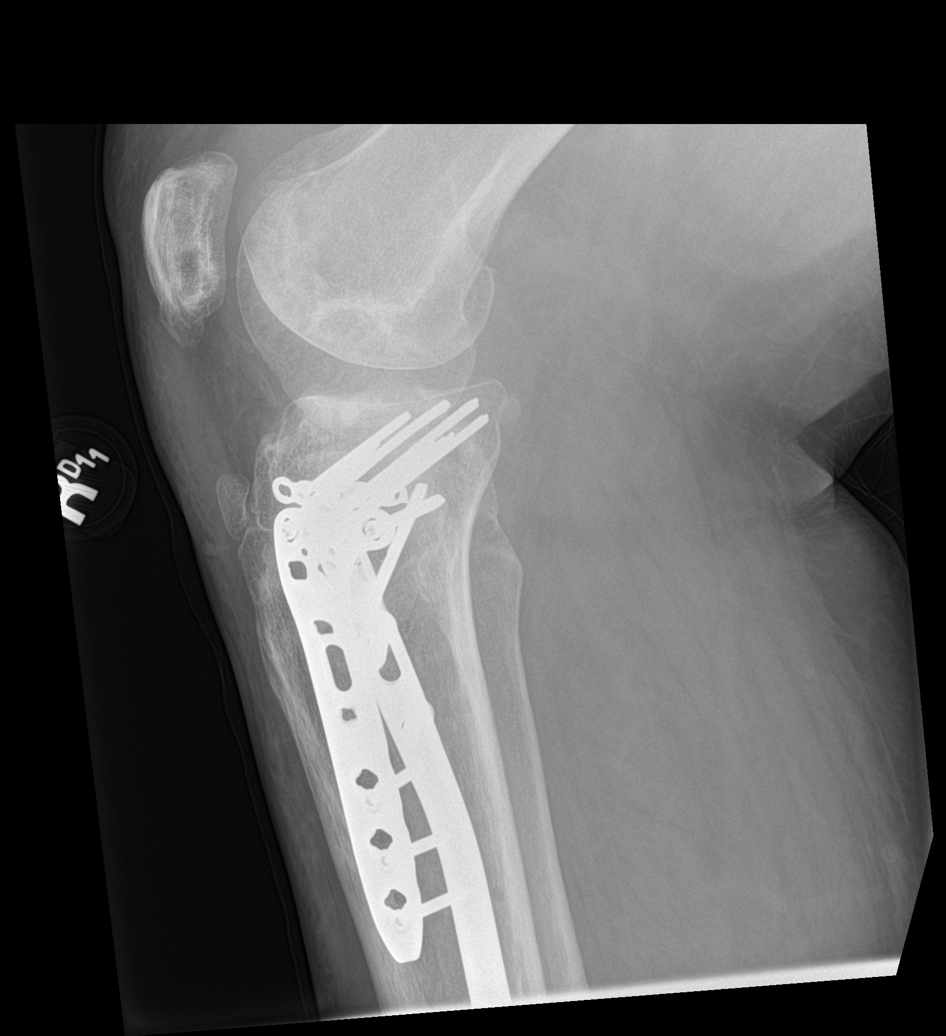

[knee obl (1 of 2)]
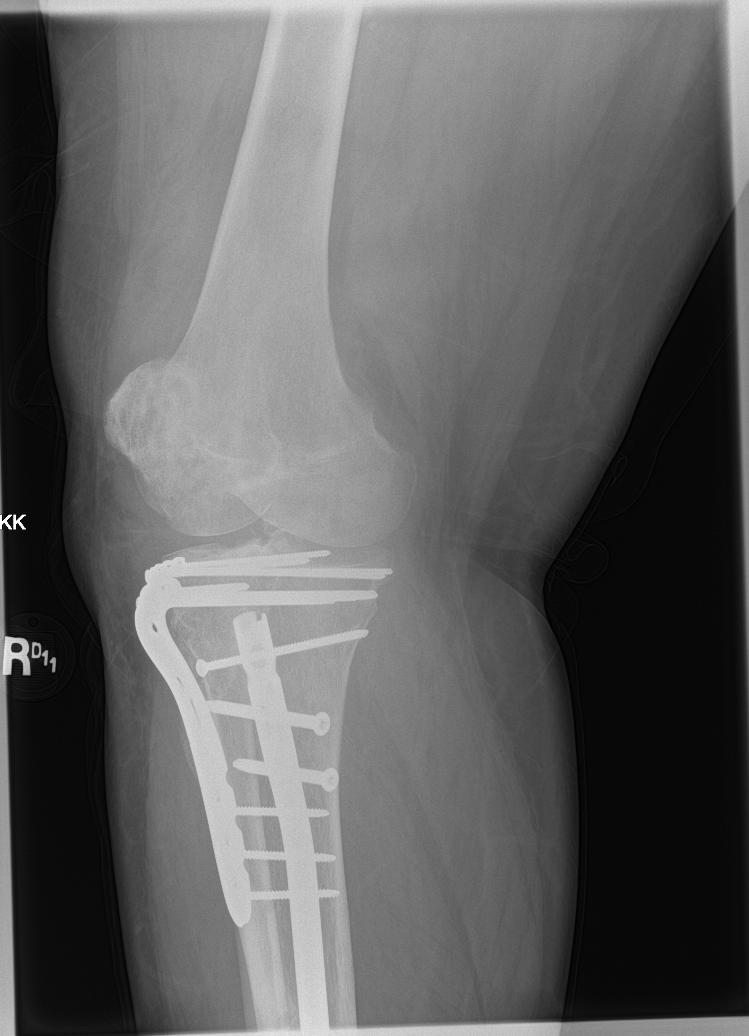

[knee ap (2 of 2)]
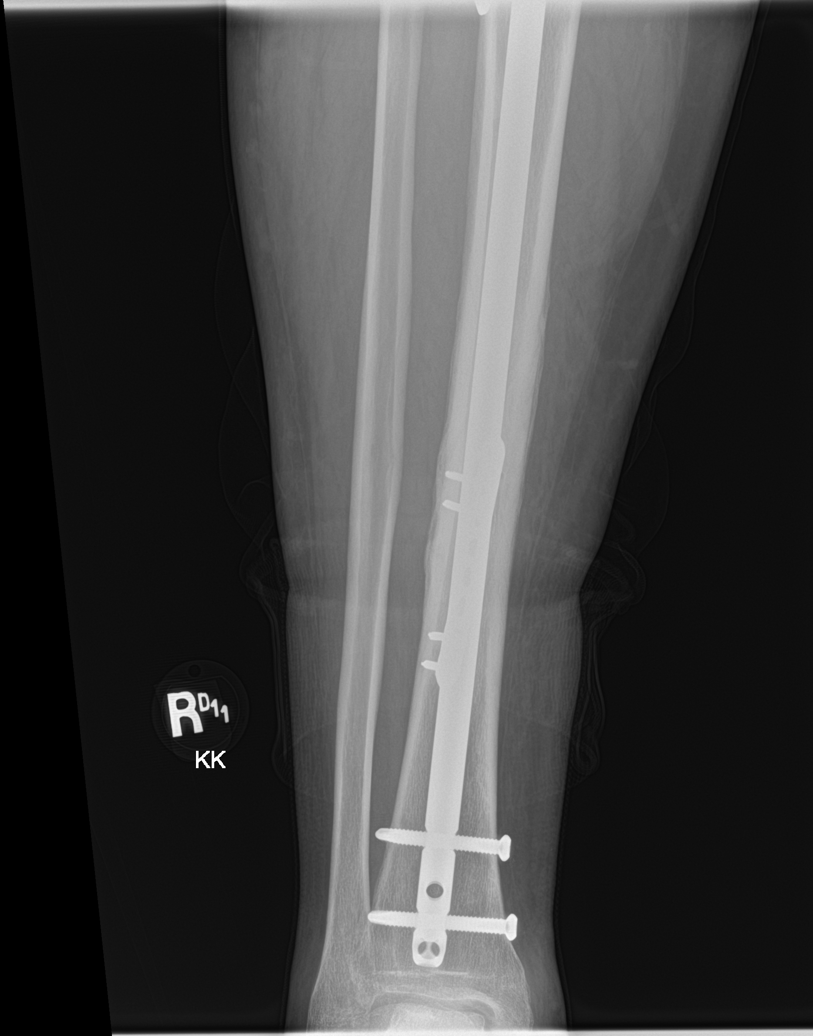

[knee obl (2 of 2)]
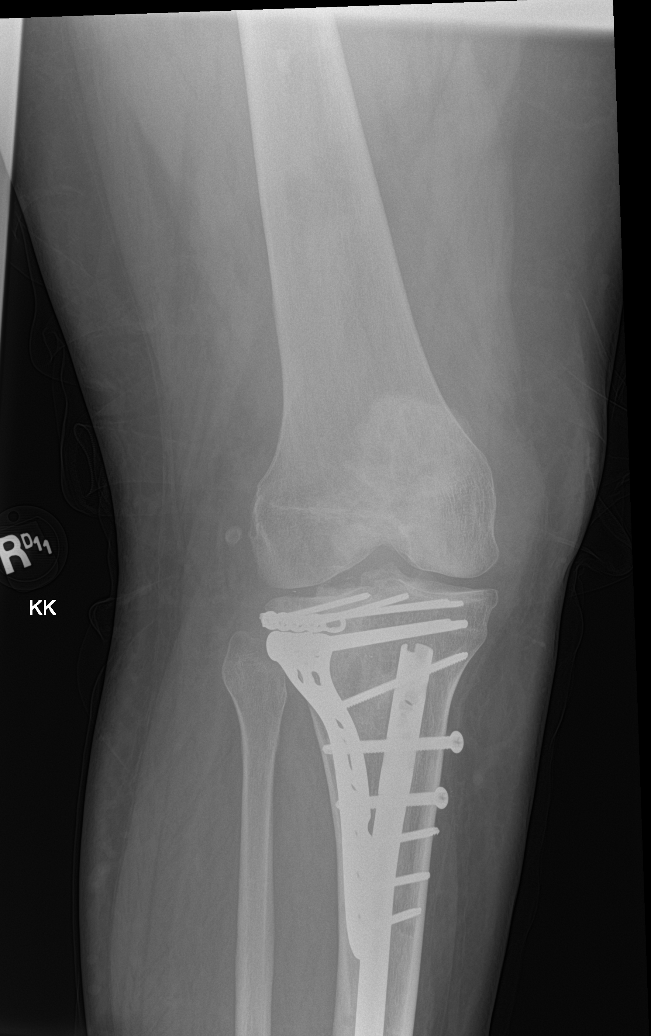

[5 of 5 positions shown; findings below may reference images not displayed]

FINDINGS: Extensive postsurgical changes from tibial ORIF with proximal
lateral sideplate and screw fixation constructs. Additional long
intramedullary rod and interlocking screw fixation. No perihardware
loosening or fracture. Mild articular surface irregularity of the
lateral tibial plateau has appearance suggestive of healed fracture.
No evidence of an acute fracture. No malalignment. Mild tibiofemoral
compartment joint space narrowing. A small joint effusion is
evident. Soft tissues within normal limits.
IMPRESSION: 1. Extensive postsurgical changes from tibial ORIF without evidence
of hardware complication. No evidence of an acute osseous
abnormality.
2. Small joint effusion.

## 2023-11-24 ENCOUNTER — Encounter: Payer: Self-pay | Admitting: Internal Medicine

## 7870-06-15 DEATH — deceased
